# Patient Record
Sex: Female | Born: 1985 | Race: White | Hispanic: No | Marital: Married | Smoking: Former smoker
Health system: Southern US, Community
[De-identification: ages and names within clinical notes are randomized; demographics above are authoritative.]

## PROBLEM LIST (undated history)

## (undated) DIAGNOSIS — F1910 Other psychoactive substance abuse, uncomplicated: Secondary | ICD-10-CM

## (undated) DIAGNOSIS — N912 Amenorrhea, unspecified: Secondary | ICD-10-CM

## (undated) HISTORY — DX: Other psychoactive substance abuse, uncomplicated: F19.10

## (undated) HISTORY — DX: Amenorrhea, unspecified: N91.2

## (undated) HISTORY — PX: TONSILLECTOMY: SHX5217

---
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
## 2007-04-24 ENCOUNTER — Inpatient Hospital Stay: Payer: Self-pay

## 2008-04-03 ENCOUNTER — Emergency Department: Payer: Self-pay | Admitting: Emergency Medicine

## 2008-04-05 ENCOUNTER — Inpatient Hospital Stay: Payer: Self-pay | Admitting: Rheumatology

## 2011-01-29 ENCOUNTER — Emergency Department: Payer: Self-pay | Admitting: Emergency Medicine

## 2016-11-30 NOTE — L&D Delivery Note (Signed)
1345 Called in room to see patient, reports pelvic pressure and the urge to push. SVE: 10/100/+2, vertex. Effective maternal pushing efforts noted.   Spontaneous vaginal birth of liveborn female patient at 60. Infant immediately to maternal abdomen. Delayed cord clamping. Three (3) vessel cord. Cord blood collected. APGARS: 8, 9. Weight: 3880 g (8 pounds 9 ounces). Receiving nurse at bedside for birth.   Pitocin infusing. Spontaneous delivery of intact placenta at 1411. Second degree perineal laceration repaired with 3-0 vicryl rapide, hemostatic.  Left periurethral laceration, hemostatic not repaired. Adequate pain relief with epidural anesthesia. EBL: 300 ml. Vault check complete x 2. Counts correct x 2.   Mom to postpartum. Infant skin to skin. Initiate routine postpartum care and orders.   FOB present at bedside for birth.   Dr. Logan Bores notified of patient's desire for postpartum bilateral tubal ligation.    Gunnar Bulla, CNM

## 2017-01-04 ENCOUNTER — Ambulatory Visit (INDEPENDENT_AMBULATORY_CARE_PROVIDER_SITE_OTHER): Payer: Medicaid Other | Admitting: Certified Nurse Midwife

## 2017-01-04 VITALS — BP 115/66 | HR 84 | Ht 63.0 in | Wt 207.4 lb

## 2017-01-04 DIAGNOSIS — Z113 Encounter for screening for infections with a predominantly sexual mode of transmission: Secondary | ICD-10-CM

## 2017-01-04 DIAGNOSIS — Z3481 Encounter for supervision of other normal pregnancy, first trimester: Secondary | ICD-10-CM

## 2017-01-04 DIAGNOSIS — E669 Obesity, unspecified: Secondary | ICD-10-CM

## 2017-01-04 DIAGNOSIS — Z1389 Encounter for screening for other disorder: Secondary | ICD-10-CM

## 2017-01-04 NOTE — Progress Notes (Signed)
Tricia Gregory presents for NOB nurse interview visit. Pregnancy confirmation done at [facility] on 12/23/2016, UPT: positive. LMP: 10/29/2016 approx, pt unsure and had irregular menses. Ultrasound needed to determine dating and viability.   G-2.  P-1001 . Pregnancy education material explained and given. No cats in the home. NOB labs ordered. TSH/HbgA1c due to Increased BMI of 35.4.  HIV labs and Drug screen were explained optional and she did not decline. Drug screen ordered. PNV encouraged. Genetic screening options discussed, undecided and may wait to discuss with provider. Pt. To follow up with provider in 2 weeks for NOB physical, unless ultrasound determines otherwise.  All questions answered.

## 2017-01-04 NOTE — Patient Instructions (Signed)
Pregnancy and Zika Virus Disease Introduction Zika virus disease, or Zika, is an illness that can spread to people from mosquitoes that carry the virus. It may also spread from person to person through infected body fluids. Zika first occurred in [place], but recently it has spread to new areas. The virus occurs in tropical climates. The location of Zika continues to change. Most people who become infected with Zika virus do not develop serious illness. However, Zika may cause birth defects in an unborn baby whose mother is infected with the virus. It may also increase the risk of miscarriage. What are the symptoms of Zika virus disease? In many cases, people who have been infected with Zika virus do not develop any symptoms. If symptoms appear, they usually start about a week after the person is infected. Symptoms are usually mild. They may include:  Fever.  Rash.  Red eyes.  Joint pain. How does Zika virus disease spread? The main way that Zika virus spreads is through the bite of a certain type of mosquito. Unlike most types of mosquitos, which bite only at night, the type of mosquito that carries Zika virus bites both at night and during the day. Zika virus can also spread through sexual contact, through a blood transfusion, and from a mother to her baby before or during birth. Once you have had Zika virus disease, it is unlikely that you will get it again. Can I pass Zika to my baby during pregnancy? Yes, Zika can pass from a mother to her baby before or during birth. What problems can Zika cause for my baby? A woman who is infected with Zika virus while pregnant is at risk of having her baby born with a condition in which the brain or head is smaller than expected (microcephaly). Babies who have microcephaly can have developmental delays, seizures, hearing problems, and vision problems. Having Zika virus disease during pregnancy can also increase the risk of miscarriage. How can Zika  virus disease be prevented? There is no vaccine to prevent Zika. The best way to prevent the disease is to avoid infected mosquitoes and avoid exposure to body fluids that can spread the virus. Avoid any possible exposure to Zika by taking the following precautions. For women and their sex partners:  Avoid traveling to high-risk areas. The locations where Zika is being reported change often. To identify high-risk areas, check the CDC travel website: www.cdc.gov/zika/geo/index.html  If you or your sex partner must travel to a high-risk area, talk with a health care provider before and after traveling.  Take all precautions to avoid mosquito bites if you live in, or travel to, any of the high-risk areas. Insect repellents are safe to use during pregnancy.  Ask your health care provider when it is safe to have sexual contact. For women:  If you are pregnant or trying to become pregnant, avoid sexual contact with persons who may have been exposed to Zika virus, persons who have possible symptoms of Zika, or persons whose history you are unsure about. If you choose to have sexual contact with someone who may have been exposed to Zika virus, use condoms correctly during the entire duration of sexual activity, every time. Do not share sexual devices, as you may be exposed to body fluids.  Ask your health care provider about when it is safe to attempt pregnancy after a possible exposure to Zika virus. What steps should I take to avoid mosquito bites? Take these steps to avoid mosquito bites when you   are in a high-risk area:  Wear loose clothing that covers your arms and legs.  Limit your outdoor activities.  Do not open windows unless they have window screens.  Sleep under mosquito nets.  Use insect repellent. The best insect repellents have:  DEET, picaridin, oil of lemon eucalyptus (OLE), or [id number] in them.  Higher amounts of an active ingredient in them.  Remember that insect repellents  are safe to use during pregnancy.  Do not use OLE on children who are younger than 3 years of age. Do not use insect repellent on babies who are younger than 2 months of age.  Cover your child's stroller with mosquito netting. Make sure the netting fits snugly and that any loose netting does not cover your child's mouth or nose. Do not use a blanket as a mosquito-protection cover.  Do not apply insect repellent underneath clothing.  If you are using sunscreen, apply the sunscreen before applying the insect repellent.  Treat clothing with permethrin. Do not apply permethrin directly to your skin. Follow label directions for safe use.  Get rid of standing water, where mosquitoes may reproduce. Standing water is often found in items such as buckets, bowls, animal food dishes, and flowerpots. When you return from traveling to any high-risk area, continue taking actions to protect yourself against mosquito bites for 3 weeks, even if you show no signs of illness. This will prevent spreading Zika virus to uninfected mosquitoes. What should I know about the sexual transmission of Zika? People can spread Zika to their sexual partners during vaginal, anal, or oral sex, or by sharing sexual devices. Many people with Zika do not develop symptoms, so a person could spread the disease without knowing that they are infected. The greatest risk is to women who are pregnant or who may become pregnant. Zika virus can live longer in semen than it can live in blood. Couples can prevent sexual transmission of the virus by:  Using condoms correctly during the entire duration of sexual activity, every time. This includes vaginal, anal, and oral sex.  Not sharing sexual devices. Sharing increases your risk of being exposed to body fluid from another person.  Avoiding all sexual activity until your health care provider says it is safe. Should I be tested for Zika virus? A sample of your blood can be tested for Zika  virus. A pregnant woman should be tested if she may have been exposed to the virus or if she has symptoms of Zika. She may also have additional tests done during her pregnancy, such ultrasound testing. Talk with your health care provider about which tests are recommended. This information is not intended to replace advice given to you by your health care provider. Make sure you discuss any questions you have with your health care provider. Document Released: 08/07/2015 Document Revised: 04/23/2016 Document Reviewed: 07/31/2015  2017 Elsevier Minor Illnesses and Medications in Pregnancy  Cold/Flu:  Sudafed for congestion- Robitussin (plain) for cough- Tylenol for discomfort.  Please follow the directions on the label.  Try not to take any more than needed.  OTC Saline nasal spray and air humidifier or cool-mist  Vaporizer to sooth nasal irritation and to loosen congestion.  It is also important to increase intake of non carbonated fluids, especially if you have a fever.  Constipation:  Colace-2 capsules at bedtime; Metamucil- follow directions on label; Senokot- 1 tablet at bedtime.  Any one of these medications can be used.  It is also very important to increase   fluids and fruits along with regular exercise.  If problem persists please call the office.  Diarrhea:  Kaopectate as directed on the label.  Eat a bland diet and increase fluids.  Avoid highly seasoned foods.  Headache:  Tylenol 1 or 2 tablets every 3-4 hours as needed  Indigestion:  Maalox, Mylanta, Tums or Rolaids- as directed on label.  Also try to eat small meals and avoid fatty, greasy or spicy foods.  Nausea with or without Vomiting:  Nausea in pregnancy is caused by increased levels of hormones in the body which influence the digestive system and cause irritation when stomach acids accumulate.  Symptoms usually subside after 1st trimester of pregnancy.  Try the following: 1. Keep saltines, graham crackers or dry toast by your bed to  eat upon awakening. 2. Don't let your stomach get empty.  Try to eat 5-6 small meals per day instead of 3 large ones. 3. Avoid greasy fatty or highly seasoned foods.  4. Take OTC Unisom 1 tablet at bed time along with OTC Vitamin B6 25-50 mg 3 times per day.    If nausea continues with vomiting and you are unable to keep down food and fluids you may need a prescription medication.  Please notify your provider.   Sore throat:  Chloraseptic spray, throat lozenges and or plain Tylenol.  Vaginal Yeast Infection:  OTC Monistat for 7 days as directed on label.  If symptoms do not resolve within a week notify provider.  If any of the above problems do not subside with recommended treatment please call the office for further assistance.   Do not take Aspirin, Advil, Motrin or Ibuprofen.  * * OTC= Over the counter Hyperemesis Gravidarum Hyperemesis gravidarum is a severe form of nausea and vomiting that happens during pregnancy. Hyperemesis is worse than morning sickness. It may cause you to have nausea or vomiting all day for many days. It may keep you from eating and drinking enough food and liquids. Hyperemesis usually occurs during the first half (the first 20 weeks) of pregnancy. It often goes away once a woman is in her second half of pregnancy. However, sometimes hyperemesis continues through an entire pregnancy. What are the causes? The cause of this condition is not known. It may be related to changes in chemicals (hormones) in the body during pregnancy, such as the high level of pregnancy hormone (human chorionic gonadotropin) or the increase in the female sex hormone (estrogen). What are the signs or symptoms? Symptoms of this condition include:  Severe nausea and vomiting.  Nausea that does not go away.  Vomiting that does not allow you to keep any food down.  Weight loss.  Body fluid loss (dehydration).  Having no desire to eat, or not liking food that you have previously  enjoyed. How is this diagnosed? This condition may be diagnosed based on:  A physical exam.  Your medical history.  Your symptoms.  Blood tests.  Urine tests. How is this treated? This condition may be managed with medicine. If medicines to do not help relieve nausea and vomiting, you may need to receive fluids through an IV tube at the hospital. Follow these instructions at home:  Take over-the-counter and prescription medicines only as told by your health care provider.  Avoid iron pills and multivitamins that contain iron for the first 3-4 months of pregnancy. If you take prescription iron pills, do not stop taking them unless your health care provider approves.  Take the following actions to help   prevent nausea and vomiting:  In the morning, before getting out of bed, try eating a couple of dry crackers or a piece of toast.  Avoid foods and smells that upset your stomach. Fatty and spicy foods may make nausea worse.  Eat 5-6 small meals a day.  Do not drink fluids while eating meals. Drink between meals.  Eat or suck on things that have ginger in them. Ginger can help relieve nausea.  Avoid food preparation. The smell of food can spoil your appetite or trigger nausea.  Follow instructions from your health care provider about eating or drinking restrictions.  For snacks, eat high-protein foods, such as cheese.  Keep all follow-up and pre-birth (prenatal) visits as told by your health care provider. This is important. Contact a health care provider if:  You have pain in your abdomen.  You have a severe headache.  You have vision problems.  You are losing weight. Get help right away if:  You cannot drink fluids without vomiting.  You vomit blood.  You have constant nausea and vomiting.  You are very weak.  You are very thirsty.  You feel dizzy.  You faint.  You have a fever or other symptoms that last for more than 2-3 days.  You have a fever and  your symptoms suddenly get worse. Summary  Hyperemesis gravidarum is a severe form of nausea and vomiting that happens during pregnancy.  Making some changes to your eating habits may help relieve nausea and vomiting.  This condition may be managed with medicine.  If medicines to do not help relieve nausea and vomiting, you may need to receive fluids through an IV tube at the hospital. This information is not intended to replace advice given to you by your health care provider. Make sure you discuss any questions you have with your health care provider. Document Released: 11/16/2005 Document Revised: 07/15/2016 Document Reviewed: 07/15/2016 Elsevier Interactive Patient Education  2017 [employer] First Trimester of Pregnancy The first trimester of pregnancy is from week 1 until the end of week 12 (months 1 through 3). During this time, your baby will begin to develop inside you. At 6-8 weeks, the eyes and face are formed, and the heartbeat can be seen on ultrasound. At the end of 12 weeks, all the baby's organs are formed. Prenatal care is all the medical care you receive before the birth of your baby. Make sure you get good prenatal care and follow all of your doctor's instructions. Follow these instructions at home: Medicines  Take medicine only as told by your doctor. Some medicines are safe and some are not during pregnancy.  Take your prenatal vitamins as told by your doctor.  Take medicine that helps you poop (stool softener) as needed if your doctor says it is okay. Diet  Eat regular, healthy meals.  Your doctor will tell you the amount of weight gain that is right for you.  Avoid raw meat and uncooked cheese.  If you feel sick to your stomach (nauseous) or throw up (vomit):  Eat 4 or 5 small meals a day instead of 3 large meals.  Try eating a few soda crackers.  Drink liquids between meals instead of during meals.  If you have a hard time pooping  (constipation):  Eat high-fiber foods like fresh vegetables, fruit, and whole grains.  Drink enough fluids to keep your pee (urine) clear or pale yellow. Activity and Exercise  Exercise only as told by your doctor. Stop exercising if   you have cramps or pain in your lower belly (abdomen) or low back.  Try to avoid standing for long periods of time. Move your legs often if you must stand in one place for a long time.  Avoid heavy lifting.  Wear low-heeled shoes. Sit and stand up straight.  You can have sex unless your doctor tells you not to. Relief of Pain or Discomfort  Wear a good support bra if your breasts are sore.  Take warm water baths (sitz baths) to soothe pain or discomfort caused by hemorrhoids. Use hemorrhoid cream if your doctor says it is okay.  Rest with your legs raised if you have leg cramps or low back pain.  Wear support hose if you have puffy, bulging veins (varicose veins) in your legs. Raise (elevate) your feet for 15 minutes, 3-4 times a day. Limit salt in your diet. Prenatal Care  Schedule your prenatal visits by the twelfth week of pregnancy.  Write down your questions. Take them to your prenatal visits.  Keep all your prenatal visits as told by your doctor. Safety  Wear your seat belt at all times when driving.  Make a list of emergency phone numbers. The list should include numbers for family, friends, the hospital, and police and fire departments. General Tips  Ask your doctor for a referral to a local prenatal class. Begin classes no later than at the start of month 6 of your pregnancy.  Ask for help if you need counseling or help with nutrition. Your doctor can give you advice or tell you where to go for help.  Do not use hot tubs, steam rooms, or saunas.  Do not douche or use tampons or scented sanitary pads.  Do not cross your legs for long periods of time.  Avoid litter boxes and soil used by cats.  Avoid all smoking, herbs, and  alcohol. Avoid drugs not approved by your doctor.  Do not use any tobacco products, including cigarettes, chewing tobacco, and electronic cigarettes. If you need help quitting, ask your doctor. You may get counseling or other support to help you quit.  Visit your dentist. At home, brush your teeth with a soft toothbrush. Be gentle when you floss. Get help if:  You are dizzy.  You have mild cramps or pressure in your lower belly.  You have a nagging pain in your belly area.  You continue to feel sick to your stomach, throw up, or have watery poop (diarrhea).  You have a bad smelling fluid coming from your vagina.  You have pain with peeing (urination).  You have increased puffiness (swelling) in your face, hands, legs, or ankles. Get help right away if:  You have a fever.  You are leaking fluid from your vagina.  You have spotting or bleeding from your vagina.  You have very bad belly cramping or pain.  You gain or lose weight rapidly.  You throw up blood. It may look like coffee grounds.  You are around people who have German measles, fifth disease, or chickenpox.  You have a very bad headache.  You have shortness of breath.  You have any kind of trauma, such as from a fall or a car accident. This information is not intended to replace advice given to you by your health care provider. Make sure you discuss any questions you have with your health care provider. Document Released: 05/04/2008 Document Revised: 04/23/2016 Document Reviewed: 09/26/2013 Else[employer] Interactive Patient Education  2017 [employer] Commonly Asked Questions   During Pregnancy  Cats: A parasite can be excreted in cat feces.  To avoid exposure you need to have another person empty the little box.  If you must empty the litter box you will need to wear gloves.  Wash your hands after handling your cat.  This parasite can also be found in raw or undercooked meat so this should also be avoided.  Colds,  Sore Throats, Flu: Please check your medication sheet to see what you can take for symptoms.  If your symptoms are unrelieved by these medications please call the office.  Dental Work: Most any dental work your dentist recommends is permitted.  X-rays should only be taken during the first trimester if absolutely necessary.  Your abdomen should be shielded with a lead apron during all x-rays.  Please notify your provider prior to receiving any x-rays.  Novocaine is fine; gas is not recommended.  If your dentist requires a note from us prior to dental work please call the office and we will provide one for you.  Exercise: Exercise is an important part of staying healthy during your pregnancy.  You may continue most exercises you were accustomed to prior to pregnancy.  Later in your pregnancy you will most likely notice you have difficulty with activities requiring balance like riding a bicycle.  It is important that you listen to your body and avoid activities that put you at a higher risk of falling.  Adequate rest and staying well hydrated are a must!  If you have questions about the safety of specific activities ask your provider.    Exposure to Children with illness: Try to avoid obvious exposure; report any symptoms to us when noted,  If you have chicken pos, red measles or mumps, you should be immune to these diseases.   Please do not take any vaccines while pregnant unless you have checked with your OB provider.  Fetal Movement: After 28 weeks we recommend you do "kick counts" twice daily.  Lie or sit down in a calm quiet environment and count your baby movements "kicks".  You should feel your baby at least 10 times per hour.  If you have not felt 10 kicks within the first hour get up, walk around and have something sweet to eat or drink then repeat for an additional hour.  If count remains less than 10 per hour notify your provider.  Fumigating: Follow your pest control agent's advice as to how long  to stay out of your home.  Ventilate the area well before re-entering.  Hemorrhoids:   Most over-the-counter preparations can be used during pregnancy.  Check your medication to see what is safe to use.  It is important to use a stool softener or fiber in your diet and to drink lots of liquids.  If hemorrhoids seem to be getting worse please call the office.   Hot Tubs:  Hot tubs Jacuzzis and saunas are not recommended while pregnant.  These increase your internal body temperature and should be avoided.  Intercourse:  Sexual intercourse is safe during pregnancy as long as you are comfortable, unless otherwise advised by your provider.  Spotting may occur after intercourse; report any bright red bleeding that is heavier than spotting.  Labor:  If you know that you are in labor, please go to the hospital.  If you are unsure, please call the office and let us help you decide what to do.  Lifting, straining, etc:  If your job requires heavy lifting or   straining please check with your provider for any limitations.  Generally, you should not lift items heavier than that you can lift simply with your hands and arms (no back muscles)  Painting:  Paint fumes do not harm your pregnancy, but may make you ill and should be avoided if possible.  Latex or water based paints have less odor than oils.  Use adequate ventilation while painting.  Permanents & Hair Color:  Chemicals in hair dyes are not recommended as they cause increase hair dryness which can increase hair loss during pregnancy.  " Highlighting" and permanents are allowed.  Dye may be absorbed differently and permanents may not hold as well during pregnancy.  Sunbathing:  Use a sunscreen, as skin burns easily during pregnancy.  Drink plenty of fluids; avoid over heating.  Tanning Beds:  Because their possible side effects are still unknown, tanning beds are not recommended.  Ultrasound Scans:  Routine ultrasounds are performed at approximately 20  weeks.  You will be able to see your baby's general anatomy an if you would like to know the gender this can usually be determined as well.  If it is questionable when you conceived you may also receive an ultrasound early in your pregnancy for dating purposes.  Otherwise ultrasound exams are not routinely performed unless there is a medical necessity.  Although you can request a scan we ask that you pay for it when conducted because insurance does not cover " patient request" scans.  Work: If your pregnancy proceeds without complications you may work until your due date, unless your physician or employer advises otherwise.  Round Ligament Pain/Pelvic Discomfort:  Sharp, shooting pains not associated with bleeding are fairly common, usually occurring in the second trimester of pregnancy.  They tend to be worse when standing up or when you remain standing for long periods of time.  These are the result of pressure of certain pelvic ligaments called "round ligaments".  Rest, Tylenol and heat seem to be the most effective relief.  As the womb and fetus grow, they rise out of the pelvis and the discomfort improves.  Please notify the office if your pain seems different than that described.  It may represent a more serious condition.   

## 2017-01-05 ENCOUNTER — Other Ambulatory Visit: Payer: Self-pay | Admitting: Obstetrics and Gynecology

## 2017-01-05 ENCOUNTER — Ambulatory Visit (INDEPENDENT_AMBULATORY_CARE_PROVIDER_SITE_OTHER): Payer: Medicaid Other

## 2017-01-05 DIAGNOSIS — Z1389 Encounter for screening for other disorder: Secondary | ICD-10-CM | POA: Diagnosis not present

## 2017-01-05 DIAGNOSIS — Z113 Encounter for screening for infections with a predominantly sexual mode of transmission: Secondary | ICD-10-CM | POA: Diagnosis not present

## 2017-01-05 DIAGNOSIS — O09899 Supervision of other high risk pregnancies, unspecified trimester: Secondary | ICD-10-CM | POA: Insufficient documentation

## 2017-01-05 DIAGNOSIS — Z3481 Encounter for supervision of other normal pregnancy, first trimester: Secondary | ICD-10-CM

## 2017-01-05 DIAGNOSIS — Z283 Underimmunization status: Secondary | ICD-10-CM | POA: Insufficient documentation

## 2017-01-05 DIAGNOSIS — O9989 Other specified diseases and conditions complicating pregnancy, childbirth and the puerperium: Principal | ICD-10-CM

## 2017-01-05 LAB — HEMOGLOBIN A1C
Est. average glucose Bld gHb Est-mCnc: 105 mg/dL
Hgb A1c MFr Bld: 5.3 % (ref 4.8–5.6)

## 2017-01-05 LAB — CBC WITH DIFFERENTIAL/PLATELET
Basophils Absolute: 0 10*3/uL (ref 0.0–0.2)
Basos: 0 %
EOS (ABSOLUTE): 0 10*3/uL (ref 0.0–0.4)
Eos: 0 %
Hematocrit: 38.6 % (ref 34.0–46.6)
Hemoglobin: 12.7 g/dL (ref 11.1–15.9)
IMMATURE GRANULOCYTES: 0 %
Immature Grans (Abs): 0 10*3/uL (ref 0.0–0.1)
LYMPHS ABS: 1.6 10*3/uL (ref 0.7–3.1)
Lymphs: 17 %
MCH: 29.1 pg (ref 26.6–33.0)
MCHC: 32.9 g/dL (ref 31.5–35.7)
MCV: 88 fL (ref 79–97)
MONOS ABS: 0.4 10*3/uL (ref 0.1–0.9)
Monocytes: 4 %
NEUTROS PCT: 79 %
Neutrophils Absolute: 7.7 10*3/uL — ABNORMAL HIGH (ref 1.4–7.0)
PLATELETS: 262 10*3/uL (ref 150–379)
RBC: 4.37 x10E6/uL (ref 3.77–5.28)
RDW: 14 % (ref 12.3–15.4)
WBC: 9.9 10*3/uL (ref 3.4–10.8)

## 2017-01-05 LAB — HEPATITIS B SURFACE ANTIGEN: Hepatitis B Surface Ag: NEGATIVE

## 2017-01-05 LAB — ANTIBODY SCREEN: Antibody Screen: NEGATIVE

## 2017-01-05 LAB — RUBELLA SCREEN

## 2017-01-05 LAB — HIV ANTIBODY (ROUTINE TESTING W REFLEX): HIV SCREEN 4TH GENERATION: NONREACTIVE

## 2017-01-05 LAB — TSH: TSH: 0.538 u[IU]/mL (ref 0.450–4.500)

## 2017-01-05 LAB — RH TYPE: RH TYPE: POSITIVE

## 2017-01-05 LAB — VARICELLA ZOSTER ANTIBODY, IGG: Varicella zoster IgG: 1237 index (ref >165)

## 2017-01-05 LAB — RPR: RPR Ser Ql: NONREACTIVE

## 2017-01-05 LAB — ABO

## 2017-01-06 LAB — URINE CULTURE, OB REFLEX

## 2017-01-06 LAB — GC/CHLAMYDIA PROBE AMP
CHLAMYDIA, DNA PROBE: NEGATIVE
NEISSERIA GONORRHOEAE BY PCR: NEGATIVE

## 2017-01-06 LAB — CULTURE, OB URINE

## 2017-01-10 LAB — MONITOR DRUG PROFILE 14(MW)
AMPHETAMINE SCREEN URINE: NEGATIVE ng/mL
BARBITURATE SCREEN URINE: NEGATIVE ng/mL
BENZODIAZEPINE SCREEN, URINE: NEGATIVE ng/mL
Buprenorphine, Urine: NEGATIVE ng/mL
CANNABINOIDS UR QL SCN: NEGATIVE ng/mL
COCAINE(METAB.)SCREEN, URINE: NEGATIVE ng/mL
CREATININE(CRT), U: 296.3 mg/dL (ref 20.0–300.0)
FENTANYL, URINE: NEGATIVE pg/mL
Meperidine Screen, Urine: NEGATIVE ng/mL
OXYCODONE+OXYMORPHONE UR QL SCN: NEGATIVE ng/mL
Opiate Scrn, Ur: NEGATIVE ng/mL
PH UR, DRUG SCRN: 7.2 (ref 4.5–8.9)
PHENCYCLIDINE QUANTITATIVE URINE: NEGATIVE ng/mL
Propoxyphene Scrn, Ur: NEGATIVE ng/mL
SPECIFIC GRAVITY: 1.027
Tramadol Screen, Urine: NEGATIVE ng/mL

## 2017-01-10 LAB — URINALYSIS, ROUTINE W REFLEX MICROSCOPIC
BILIRUBIN UA: NEGATIVE
GLUCOSE, UA: NEGATIVE
KETONES UA: NEGATIVE
Nitrite, UA: NEGATIVE
Urobilinogen, Ur: 0.2 mg/dL (ref 0.2–1.0)
pH, UA: 7 (ref 5.0–7.5)

## 2017-01-10 LAB — NICOTINE SCREEN, URINE: Cotinine Ql Scrn, Ur: POSITIVE ng/mL

## 2017-01-10 LAB — MICROSCOPIC EXAMINATION
CASTS: NONE SEEN /LPF
Epithelial Cells (non renal): >10 /hpf — AB (ref 0–10)

## 2017-01-10 LAB — METHADONE (GC/LC/MS), URINE
METHADONE GC/MS CONF: 1950 ng/mL
METHADONE: POSITIVE — AB

## 2017-01-11 ENCOUNTER — Encounter: Payer: Self-pay | Admitting: Obstetrics and Gynecology

## 2017-01-12 ENCOUNTER — Other Ambulatory Visit: Payer: Self-pay | Admitting: Obstetrics and Gynecology

## 2017-01-19 ENCOUNTER — Other Ambulatory Visit: Payer: Self-pay | Admitting: Obstetrics and Gynecology

## 2017-01-19 ENCOUNTER — Ambulatory Visit (INDEPENDENT_AMBULATORY_CARE_PROVIDER_SITE_OTHER): Payer: Medicaid Other | Admitting: Obstetrics and Gynecology

## 2017-01-19 ENCOUNTER — Encounter: Payer: Self-pay | Admitting: Obstetrics and Gynecology

## 2017-01-19 VITALS — BP 127/62 | HR 86 | Wt 207.5 lb

## 2017-01-19 DIAGNOSIS — E669 Obesity, unspecified: Secondary | ICD-10-CM | POA: Insufficient documentation

## 2017-01-19 DIAGNOSIS — Z3401 Encounter for supervision of normal first pregnancy, first trimester: Secondary | ICD-10-CM

## 2017-01-19 DIAGNOSIS — Z283 Underimmunization status: Secondary | ICD-10-CM

## 2017-01-19 DIAGNOSIS — O9989 Other specified diseases and conditions complicating pregnancy, childbirth and the puerperium: Secondary | ICD-10-CM

## 2017-01-19 DIAGNOSIS — Z2839 Other underimmunization status: Secondary | ICD-10-CM

## 2017-01-19 DIAGNOSIS — F1120 Opioid dependence, uncomplicated: Secondary | ICD-10-CM

## 2017-01-19 DIAGNOSIS — F17200 Nicotine dependence, unspecified, uncomplicated: Secondary | ICD-10-CM | POA: Insufficient documentation

## 2017-01-19 LAB — POCT URINALYSIS DIPSTICK
Bilirubin, UA: NEGATIVE
Glucose, UA: NEGATIVE
KETONES UA: NEGATIVE
Nitrite, UA: NEGATIVE
PH UA: 6
PROTEIN UA: NEGATIVE
SPEC GRAV UA: 1.01
UROBILINOGEN UA: 0.2

## 2017-01-19 NOTE — Patient Instructions (Signed)
Smoking During Pregnancy Smoking during pregnancy is unhealthy for you and your baby. Smoke from cigarettes, pipes, and cigars contains many chemicals that can cause cancer (carcinogens). Cigarettes also contain a stimulant drug (nicotine). When you smoke, harmful substances that you breathe in enter your bloodstream and can be passed on to your baby. This can affect your baby's development. If you are planning to become pregnant or have recently become pregnant, talk with your health care provider about quitting smoking. How does smoking affect me? Smoking increases your risk for many long-term (chronic) diseases. These diseases include cancer, lung diseases, and heart disease. Smoking during pregnancy increases your risk of:  Losing the pregnancy (miscarriage or stillbirth).  Giving birth too early (premature birth).  Pregnancy outside of the uterus (tubal pregnancy).  Having problems with the organ that provides the baby nourishment and oxygen (placenta), including:  Attachment of the placenta over the opening of the uterus (placenta previa).  Detachment of the placenta before the baby's birth (placental abruption).  Having your water break before labor begins (premature rupture of membranes). How does smoking affect my baby? Before Birth  Smoking during pregnancy:  Decreases blood flow and oxygen to your baby.  Increases your baby's risk of birth defects, such as heart defects.  Increases your baby's heart rate.  Slows your baby's growth in the uterus (intrauterine growth retardation). After Birth  Babies born to women who smoked during pregnancy may:  Have symptoms of nicotine withdrawal.  Need to stay in the hospital for special care.  May be too small at birth.  Have a high risk of:  Serious health problems or lifelong disabilities.  Sudden infant death syndrome (SIDS).  Becoming obese.  Developing behavior or learning problems. What can happen if changes are  not made? When babies are born with a birth defect or illness, they often need to stay in the hospital longer before going home. Hospital stays may also be longer if you had any complications during labor or delivery. Longer hospital stays and more treatments result in higher costs for health care. Many health issues among babies born to mothers who smoke can have a lifelong impact. This may include the long-term need for certain medicines, therapies, or other treatments. What are the benefits of not smoking during pregnancy? You have a much better chance of having a healthy pregnancy and a healthy baby if you do not smoke while you are pregnant. Not smoking also means that you will have a better chance of living a long and healthy life, and your baby will have a better chance of growing into a healthy child and adult. What actions can be taken? Quitting smoking can be difficult. Ask your health care provider for help to stop smoking. You may also consider:  Counseling to help you quit smoking (smoking cessation counseling).  Psychotherapy.  Acupuncture.  Hypnosis.  Telephone QUIT hotlines. If these methods do not help you, talk with your health care provider about other options. Do not take smoking cessation medicines or nicotine supplements unless your health care provider tells you to. Where to find more information: Learn more about smoking during pregnancy and quitting smoking from:  March of Dimes: www.marchofdimes.org/pregnancy/smoking-during-pregnancy.aspx  U.S. Department of Health and Human Services: women.smokefree.gov  American Cancer Society: www.cancer.org  American Heart Association: www.heart.org  National Cancer Institute: www.cancer.gov For help to quit smoking:  National smoking cessation telephone hotline: 1-800-QUIT NOW (784-8669) Contact a health care provider if:  You are struggling to quit smoking.    You are a smoker and you become pregnant or plan to  become pregnant.  You start smoking again after giving birth. Summary  Tobacco smoke contains harmful substances that can affect a baby's health and development.  Smoking increases the risk for serious problems, such as miscarriage, birth defects, or premature birth.  If you need help to quit smoking, ask your health care provider. This information is not intended to replace advice given to you by your health care provider. Make sure you discuss any questions you have with your health care provider. Document Released: 03/30/2005 Document Revised: 09/04/2016 Document Reviewed: 09/04/2016 Elsevier Interactive Patient Education  2017 [employer] Second Trimester of Pregnancy The second trimester is from week 13 through week 28 (months 4 through 6). The second trimester is often a time when you feel your best. Your body has also adjusted to being pregnant, and you begin to feel better physically. Usually, morning sickness has lessened or quit completely, you may have more energy, and you may have an increase in appetite. The second trimester is also a time when the fetus is growing rapidly. At the end of the sixth month, the fetus is about 9 inches long and weighs about 1 pounds. You will likely begin to feel the baby move (quickening) between 18 and 20 weeks of the pregnancy. Body changes during your second trimester Your body continues to go through many changes during your second trimester. The changes vary from woman to woman.  Your weight will continue to increase. You will notice your lower abdomen bulging out.  You may begin to get stretch marks on your hips, abdomen, and breasts.  You may develop headaches that can be relieved by medicines. The medicines should be approved by your health care provider.  You may urinate more often because the fetus is pressing on your bladder.  You may develop or continue to have heartburn as a result of your pregnancy.  You may develop  constipation because certain hormones are causing the muscles that push waste through your intestines to slow down.  You may develop hemorrhoids or swollen, bulging veins (varicose veins).  You may have back pain. This is caused by:  Weight gain.  Pregnancy hormones that are relaxing the joints in your pelvis.  A shift in weight and the muscles that support your balance.  Your breasts will continue to grow and they will continue to become tender.  Your gums may bleed and may be sensitive to brushing and flossing.  Dark spots or blotches (chloasma, mask of pregnancy) may develop on your face. This will likely fade after the baby is born.  A dark line from your belly button to the pubic area (linea nigra) may appear. This will likely fade after the baby is born.  You may have changes in your hair. These can include thickening of your hair, rapid growth, and changes in texture. Some women also have hair loss during or after pregnancy, or hair that feels dry or thin. Your hair will most likely return to normal after your baby is born. What to expect at prenatal visits During a routine prenatal visit:  You will be weighed to make sure you and the fetus are growing normally.  Your blood pressure will be taken.  Your abdomen will be measured to track your baby's growth.  The fetal heartbeat will be listened to.  Any test results from the previous visit will be discussed. Your health care provider may ask you:  How you  are feeling.  If you are feeling the baby move.  If you have had any abnormal symptoms, such as leaking fluid, bleeding, severe headaches, or abdominal cramping.  If you are using any tobacco products, including cigarettes, chewing tobacco, and electronic cigarettes.  If you have any questions. Other tests that may be performed during your second trimester include:  Blood tests that check for:  Low iron levels (anemia).  Gestational diabetes (between 24 and 28  weeks).  Rh antibodies. This is to check for a protein on red blood cells (Rh factor).  Urine tests to check for infections, diabetes, or protein in the urine.  An ultrasound to confirm the proper growth and development of the baby.  An amniocentesis to check for possible genetic problems.  Fetal screens for spina bifida and Down syndrome.  HIV (human immunodeficiency virus) testing. Routine prenatal testing includes screening for HIV, unless you choose not to have this test. Follow these instructions at home: Eating and drinking  Continue to eat regular, healthy meals.  Avoid raw meat, uncooked cheese, cat litter boxes, and soil used by cats. These carry germs that can cause birth defects in the baby.  Take your prenatal vitamins.  Take 1500-2000 mg of calcium daily starting at the 20th week of pregnancy until you deliver your baby.  If you develop constipation:  Take over-the-counter or prescription medicines.  Drink enough fluid to keep your urine clear or pale yellow.  Eat foods that are high in fiber, such as fresh fruits and vegetables, whole grains, and beans.  Limit foods that are high in fat and processed sugars, such as fried and sweet foods. Activity  Exercise only as directed by your health care provider. Experiencing uterine cramps is a good sign to stop exercising.  Avoid heavy lifting, wear low heel shoes, and practice good posture.  Wear your seat belt at all times when driving.  Rest with your legs elevated if you have leg cramps or low back pain.  Wear a good support bra for breast tenderness.  Do not use hot tubs, steam rooms, or saunas. Lifestyle  Avoid all smoking, herbs, alcohol, and unprescribed drugs. These chemicals affect the formation and growth of the baby.  Do not use any products that contain nicotine or tobacco, such as cigarettes and e-cigarettes. If you need help quitting, ask your health care provider.  A sexual relationship may be  continued unless your health care provider directs you otherwise. General instructions  Follow your health care provider's instructions regarding medicine use. There are medicines that are either safe or unsafe to take during pregnancy.  Take warm sitz baths to soothe any pain or discomfort caused by hemorrhoids. Use hemorrhoid cream if your health care provider approves.  If you develop varicose veins, wear support hose. Elevate your feet for 15 minutes, 3-4 times a day. Limit salt in your diet.  Visit your dentist if you have not gone yet during your pregnancy. Use a soft toothbrush to brush your teeth and be gentle when you floss.  Keep all follow-up prenatal visits as told by your health care provider. This is important. Contact a health care provider if:  You have dizziness.  You have mild pelvic cramps, pelvic pressure, or nagging pain in the abdominal area.  You have persistent nausea, vomiting, or diarrhea.  You have a bad smelling vaginal discharge.  You have pain with urination. Get help right away if:  You have a fever.  You are leaking fluid from  your vagina.  You have spotting or bleeding from your vagina.  You have severe abdominal cramping or pain.  You have rapid weight gain or weight loss.  You have shortness of breath with chest pain.  You notice sudden or extreme swelling of your face, hands, ankles, feet, or legs.  You have not felt your baby move in over an hour.  You have severe headaches that do not go away with medicine.  You have vision changes. Summary  The second trimester is from week 13 through week 28 (months 4 through 6). It is also a time when the fetus is growing rapidly.  Your body goes through many changes during pregnancy. The changes vary from woman to woman.  Avoid all smoking, herbs, alcohol, and unprescribed drugs. These chemicals affect the formation and growth your baby.  Do not use any tobacco products, such as cigarettes,  chewing tobacco, and e-cigarettes. If you need help quitting, ask your health care provider.  Contact your health care provider if you have any questions. Keep all prenatal visits as told by your health care provider. This is important. This information is not intended to replace advice given to you by your health care provider. Make sure you discuss any questions you have with your health care provider. Document Released: 11/10/2001 Document Revised: 04/23/2016 Document Reviewed: 01/17/2013 Elsevier Interactive Patient Education  2017 [employer].

## 2017-01-19 NOTE — Progress Notes (Signed)
NOB physical- pt is doing well, denies any complaints 

## 2017-01-19 NOTE — Progress Notes (Signed)
NEW OB HISTORY AND PHYSICAL  SUBJECTIVE:       Tricia MooresChristine A Gregory is a 31 y.o. [id number] female, Patient's last menstrual period was 10/29/2016 (approximate)., Estimated Date of Delivery: 08/05/17, 614w5d, presents today for establishment of Prenatal Care. She has no unusual complaints.      Gynecologic History Patient's last menstrual period was 10/29/2016 (approximate). Normal Contraception: none Last Pap: 2009. Results were: normal  Obstetric History OB History  Gravida Para Term Preterm AB Living  2 1 1     1   SAB TAB Ectopic Multiple Live Births          1    # Outcome Date GA Lbr Len/2nd Weight Sex Delivery Anes PTL Lv  2 Current           1 Term 04/25/07 [id number]  8 lb 11.2 oz (3.946 kg) F Vag-Spont  N LIV      Past Medical History:  Diagnosis Date  . Amenorrhea   . Substance abuse    started Methadone in 2015    Past Surgical History:  Procedure Laterality Date  . TONSILLECTOMY      Current Outpatient Prescriptions on File Prior to Visit  Medication Sig Dispense Refill  . methadone (DOLOPHINE) 10 MG/5ML solution Take 35 mg by mouth daily.    . Prenatal Vit-Fe Fumarate-FA (MULTIVITAMIN-PRENATAL) 27-0.8 MG TABS tablet Take 1 tablet by mouth daily at 12 noon.     No current facility-administered medications on file prior to visit.     No Known Allergies  Social History   Social History  . Marital status: Married    Spouse name: N/A  . Number of children: N/A  . Years of education: N/A   Occupational History  . Not on file.   Social History Main Topics  . Smoking status: Light Tobacco Smoker    Types: Cigarettes  . Smokeless tobacco: Never Used     Comment: 1-2 cigarettes  . Alcohol use No  . Drug use: Yes  . Sexual activity: Yes    Partners: Male    Birth control/ protection: None   Other Topics Concern  . Not on file   Social History Narrative  . No narrative on file    Family History  Problem Relation Age of Onset  . Cancer Maternal  Grandmother 50    breast  . Diabetes Maternal Grandfather     The following portions of the patient's history were reviewed and updated as appropriate: allergies, current medications, past OB history, past medical history, past surgical history, past family history, past social history, and problem list.    OBJECTIVE: Initial Physical Exam (New OB)  GENERAL APPEARANCE: alert, well appearing, in no apparent distress, oriented to person, place and time, overweight HEAD: normocephalic, atraumatic MOUTH: mucous membranes moist, pharynx normal without lesions and dental hygiene good THYROID: no thyromegaly or masses present BREASTS: not examined LUNGS: clear to auscultation, no wheezes, rales or rhonchi, symmetric air entry HEART: regular rate and rhythm, no murmurs ABDOMEN: soft, nontender, nondistended, no abnormal masses, no epigastric pain, obese, fundus not palpable and FHT present EXTREMITIES: no redness or tenderness in the calves or thighs SKIN: normal coloration and turgor, no rashes LYMPH NODES: no adenopathy palpable NEUROLOGIC: alert, oriented, normal speech, no focal findings or movement disorder noted  PELVIC EXAM EXTERNAL GENITALIA: normal appearing vulva with no masses, tenderness or lesions VAGINA: discharge thin and slight green in color CERVIX: no lesions or cervical motion tenderness UTERUS: gravid and consistent with  12 weeks ADNEXA: no masses palpable and nontender  ASSESSMENT: Normal pregnancy BMI 35 Methadone use Smoker- trying to quit  PLAN: Prenatal care Genetic screening obtained today Counseled on tobacco and methadone use in pregnancy and NAS Early glucola at 16-20 weeks See orders

## 2017-01-20 LAB — CYTOLOGY - PAP

## 2017-01-26 ENCOUNTER — Other Ambulatory Visit: Payer: Self-pay | Admitting: Obstetrics and Gynecology

## 2017-01-26 LAB — MATERNIT 21 PLUS CORE, BLOOD
CHROMOSOME 13: NEGATIVE
CHROMOSOME 18: NEGATIVE
CHROMOSOME 21: NEGATIVE
Y Chromosome: DETECTED

## 2017-02-01 LAB — REQUEST PROBLEM

## 2017-02-16 ENCOUNTER — Ambulatory Visit (INDEPENDENT_AMBULATORY_CARE_PROVIDER_SITE_OTHER): Payer: Medicaid Other | Admitting: Obstetrics and Gynecology

## 2017-02-16 ENCOUNTER — Other Ambulatory Visit: Payer: Medicaid Other

## 2017-02-16 VITALS — BP 124/70 | HR 98 | Wt 210.2 lb

## 2017-02-16 DIAGNOSIS — Z3492 Encounter for supervision of normal pregnancy, unspecified, second trimester: Secondary | ICD-10-CM

## 2017-02-16 DIAGNOSIS — R638 Other symptoms and signs concerning food and fluid intake: Secondary | ICD-10-CM

## 2017-02-16 LAB — POCT URINALYSIS DIPSTICK
Bilirubin, UA: NEGATIVE
GLUCOSE UA: NEGATIVE
Ketones, UA: NEGATIVE
NITRITE UA: NEGATIVE
SPEC GRAV UA: 1.015 (ref 1.030–1.035)
UROBILINOGEN UA: 0.2 (ref ?–2.0)
pH, UA: 7 (ref 5.0–8.0)

## 2017-02-16 NOTE — Addendum Note (Signed)
Addended by: Rosine BeatLONTZ, AMY L on: 02/16/2017 11:57 AM   Modules accepted: Orders

## 2017-02-16 NOTE — Progress Notes (Signed)
ROB-doing well except constipation- OK to add miralax as needed. Early glucola done today. Anatomy scan next visit.

## 2017-02-16 NOTE — Progress Notes (Signed)
ROB-early glucola done, pt is doing well 

## 2017-02-17 LAB — GLUCOSE, 1 HOUR GESTATIONAL: GESTATIONAL DIABETES SCREEN: 99 mg/dL (ref 65–139)

## 2017-02-18 LAB — URINE CULTURE

## 2017-03-15 ENCOUNTER — Other Ambulatory Visit: Payer: Self-pay | Admitting: Certified Nurse Midwife

## 2017-03-15 DIAGNOSIS — Z369 Encounter for antenatal screening, unspecified: Secondary | ICD-10-CM

## 2017-03-16 ENCOUNTER — Ambulatory Visit (INDEPENDENT_AMBULATORY_CARE_PROVIDER_SITE_OTHER): Payer: Medicaid Other | Admitting: Certified Nurse Midwife

## 2017-03-16 ENCOUNTER — Ambulatory Visit (INDEPENDENT_AMBULATORY_CARE_PROVIDER_SITE_OTHER): Payer: Medicaid Other

## 2017-03-16 VITALS — BP 128/70 | HR 77 | Wt 212.4 lb

## 2017-03-16 DIAGNOSIS — R319 Hematuria, unspecified: Secondary | ICD-10-CM

## 2017-03-16 DIAGNOSIS — Z369 Encounter for antenatal screening, unspecified: Secondary | ICD-10-CM

## 2017-03-16 DIAGNOSIS — Z3482 Encounter for supervision of other normal pregnancy, second trimester: Secondary | ICD-10-CM | POA: Diagnosis not present

## 2017-03-16 LAB — POCT URINALYSIS DIPSTICK
Bilirubin, UA: NEGATIVE
Glucose, UA: NEGATIVE
KETONES UA: NEGATIVE
Nitrite, UA: NEGATIVE
PROTEIN UA: NEGATIVE
SPEC GRAV UA: 1.015 (ref 1.010–1.025)
UROBILINOGEN UA: NEGATIVE U/dL — AB
pH, UA: 7 (ref 5.0–8.0)

## 2017-03-16 NOTE — Patient Instructions (Signed)

## 2017-03-16 NOTE — Progress Notes (Signed)
ROB, doing well . No complaints. Anatomy scan today, reviewed results with pt.See below for results. She denies contractions, vaginal bleeding and discharge. She endorses good fetal movement. Reviewed fetal movement and correlation with fetal well being.  She verbalizes understanding . She will return in 4 wks .    ULTRASOUND REPORT  Location: [facility] Date of Service: 03/16/17  Indications:Anatomy U/S Findings:  Mason Jim intrauterine pregnancy is visualized with FHR at 147 BPM. Biometrics give an (U/S) Gestational age of 26 1/7 weeks and an (U/S) EDD of 08/02/17; this correlates with the clinically established EDD of 08/05/17.  Fetal presentation is Vertex.  EFW: 332g (12 oz). Placenta: Posterior, grade 0, 4.1 cm from internal os. AFI: Adequate with MVP of 3.4 cm.  Anatomic survey is complete and normal; Gender - female  .   Ovaries are not seen Survey of the adnexa demonstrates no adnexal masses. There is no free peritoneal fluid in the cul de sac.  Impression: 1. 20 1/7 week Viable Singleton Intrauterine pregnancy by U/S. 2. (U/S) EDD is consistent with Clinically established (LMP) EDD of 08/05/17. 3. Normal Anatomy Scan  Recommendations: 1.Clinical correlation with the patient's History and Physical Exam.   Boyce Medici

## 2017-03-18 LAB — CULTURE, OB URINE

## 2017-03-18 LAB — URINE CULTURE, OB REFLEX

## 2017-03-22 ENCOUNTER — Encounter: Payer: Self-pay | Admitting: Certified Nurse Midwife

## 2017-04-15 ENCOUNTER — Ambulatory Visit (INDEPENDENT_AMBULATORY_CARE_PROVIDER_SITE_OTHER): Payer: Medicaid Other | Admitting: Certified Nurse Midwife

## 2017-04-15 ENCOUNTER — Encounter: Payer: Self-pay | Admitting: Certified Nurse Midwife

## 2017-04-15 VITALS — BP 112/67 | HR 70 | Wt 214.8 lb

## 2017-04-15 DIAGNOSIS — Z131 Encounter for screening for diabetes mellitus: Secondary | ICD-10-CM

## 2017-04-15 DIAGNOSIS — K5900 Constipation, unspecified: Secondary | ICD-10-CM

## 2017-04-15 DIAGNOSIS — Z130 Encounter for screening for diseases of the blood and blood-forming organs and certain disorders involving the immune mechanism: Secondary | ICD-10-CM

## 2017-04-15 DIAGNOSIS — O99612 Diseases of the digestive system complicating pregnancy, second trimester: Secondary | ICD-10-CM | POA: Insufficient documentation

## 2017-04-15 DIAGNOSIS — Z3482 Encounter for supervision of other normal pregnancy, second trimester: Secondary | ICD-10-CM

## 2017-04-15 LAB — POCT URINALYSIS DIPSTICK
Bilirubin, UA: NEGATIVE
Blood, UA: NEGATIVE
Glucose, UA: NEGATIVE
KETONES UA: NEGATIVE
LEUKOCYTES UA: NEGATIVE
Nitrite, UA: NEGATIVE
PROTEIN UA: NEGATIVE
Spec Grav, UA: 1.015 (ref 1.010–1.025)
Urobilinogen, UA: NEGATIVE E.U./dL — AB
pH, UA: 7.5 (ref 5.0–8.0)

## 2017-04-15 MED ORDER — BISACODYL 5 MG PO TBEC: 5.0000 mg | DELAYED_RELEASE_TABLET | Freq: Every day | ORAL | 0 refills | Status: AC | PRN

## 2017-04-15 NOTE — Patient Instructions (Signed)
Common Medications Safe in Pregnancy  Acne:      Constipation:  Benzoyl Peroxide     Colace  Clindamycin      Dulcolax Suppository  Topica Erythromycin     Fibercon  Salicylic Acid      Metamucil         Miralax AVOID:        Senakot   Accutane    Cough:  Retin-A       Cough Drops  Tetracycline      Phenergan w/ Codeine if Rx  Minocycline      Robitussin (Plain & DM)  Antibiotics:     Crabs/Lice:  Ceclor       RID  Cephalosporins    AVOID:  E-Mycins      Kwell  Keflex  Macrobid/Macrodantin   Diarrhea:  Penicillin      Kao-Pectate  Zithromax      Imodium AD         PUSH FLUIDS AVOID:       Cipro     Fever:  Tetracycline      Tylenol (Regular or Extra  Minocycline       Strength)  Levaquin      Extra Strength-Do not          Exceed 8 tabs/24 hrs Caffeine:        <200mg/day (equiv. To 1 cup of coffee or  approx. 3 12 oz sodas)         Gas: Cold/Hayfever:       Gas-X  Benadryl      Mylicon  Claritin       Phazyme  **Claritin-D        Chlor-Trimeton    Headaches:  Dimetapp      ASA-Free Excedrin  Drixoral-Non-Drowsy     Cold Compress  Mucinex (Guaifenasin)     Tylenol (Regular or Extra  Sudafed/Sudafed-12 Hour     Strength)  **Sudafed PE Pseudoephedrine   Tylenol Cold & Sinus     Vicks Vapor Rub  Zyrtec  **AVOID if Problems With Blood Pressure         Heartburn: Avoid lying down for at least 1 hour after meals  Aciphex      Maalox     Rash:  Milk of Magnesia     Benadryl    Mylanta       1% Hydrocortisone Cream  Pepcid  Pepcid Complete   Sleep Aids:  Prevacid      Ambien   Prilosec       Benadryl  Rolaids       Chamomile Tea  Tums (Limit 4/day)     Unisom  Zantac       Tylenol PM         Warm milk-add vanilla or  Hemorrhoids:       Sugar for taste  Anusol/Anusol H.C.  (RX: Analapram 2.5%)  Sugar Substitutes:  Hydrocortisone OTC     Ok in moderation  Preparation H      Tucks        Vaseline lotion applied to tissue with  wiping    Herpes:     Throat:  Acyclovir      Oragel  Famvir  Valtrex     Vaccines:         Flu Shot Leg Cramps:       *Gardasil  Benadryl      Hepatitis A         Hepatitis B Nasal Spray:         Pneumovax  Saline Nasal Spray     Polio Booster         Tetanus Nausea:       Tuberculosis test or PPD  Vitamin B6 25 mg TID   AVOID:    Dramamine      *Gardasil  Emetrol       Live Poliovirus  Ginger Root 250 mg QID    MMR (measles, mumps &  High Complex Carbs @ Bedtime    rebella)  Sea Bands-Accupressure    Varicella (Chickenpox)  Unisom 1/2 tab TID     *No known complications           If received before Pain:         Known pregnancy;   Darvocet       Resume series after  Lortab        Delivery  Percocet    Yeast:   Tramadol      Femstat  Tylenol 3      Gyne-lotrimin  Ultram       Monistat  Vicodin           MISC:         All Sunscreens           Hair Coloring/highlights          Insect Repellant's          (Including DEET)         Mystic Tans Second Trimester of Pregnancy The second trimester is from week 13 through week 28, month 4 through 6. This is often the time in pregnancy that you feel your best. Often times, morning sickness has lessened or quit. You may have more energy, and you may get hungry more often. Your unborn baby (fetus) is growing rapidly. At the end of the sixth month, he or she is about 9 inches long and weighs about 1 pounds. You will likely feel the baby move (quickening) between 18 and 20 weeks of pregnancy. Follow these instructions at home:  Avoid all smoking, herbs, and alcohol. Avoid drugs not approved by your doctor.  Do not use any tobacco products, including cigarettes, chewing tobacco, and electronic cigarettes. If you need help quitting, ask your doctor. You may get counseling or other support to help you quit.  Only take medicine as told by your doctor. Some medicines are safe and some are not during pregnancy.  Exercise only as told by your  doctor. Stop exercising if you start having cramps.  Eat regular, healthy meals.  Wear a good support bra if your breasts are tender.  Do not use hot tubs, steam rooms, or saunas.  Wear your seat belt when driving.  Avoid raw meat, uncooked cheese, and liter boxes and soil used by cats.  Take your prenatal vitamins.  Take 1500-2000 milligrams of calcium daily starting at the 20th week of pregnancy until you deliver your baby.  Try taking medicine that helps you poop (stool softener) as needed, and if your doctor approves. Eat more fiber by eating fresh fruit, vegetables, and whole grains. Drink enough fluids to keep your pee (urine) clear or pale yellow.  Take warm water baths (sitz baths) to soothe pain or discomfort caused by hemorrhoids. Use hemorrhoid cream if your doctor approves.  If you have puffy, bulging veins (varicose veins), wear support hose. Raise (elevate) your feet for 15 minutes, 3-4 times a day. Limit salt in your diet.  Avoid heavy lifting, wear low heals, and sit up straight.    Rest with your legs raised if you have leg cramps or low back pain.  Visit your dentist if you have not gone during your pregnancy. Use a soft toothbrush to brush your teeth. Be gentle when you floss.  You can have sex (intercourse) unless your doctor tells you not to.  Go to your doctor visits. Get help if:  You feel dizzy.  You have mild cramps or pressure in your lower belly (abdomen).  You have a nagging pain in your belly area.  You continue to feel sick to your stomach (nauseous), throw up (vomit), or have watery poop (diarrhea).  You have bad smelling fluid coming from your vagina.  You have pain with peeing (urination). Get help right away if:  You have a fever.  You are leaking fluid from your vagina.  You have spotting or bleeding from your vagina.  You have severe belly cramping or pain.  You lose or gain weight rapidly.  You have trouble catching your  breath and have chest pain.  You notice sudden or extreme puffiness (swelling) of your face, hands, ankles, feet, or legs.  You have not felt the baby move in over an hour.  You have severe headaches that do not go away with medicine.  You have vision changes. This information is not intended to replace advice given to you by your health care provider. Make sure you discuss any questions you have with your health care provider. Document Released: 02/10/2010 Document Revised: 04/23/2016 Document Reviewed: 01/17/2013 Elsevier Interactive Patient Education  2017 [employer]  

## 2017-04-16 NOTE — Progress Notes (Signed)
ROB-Pt doing well, reports constipation requiring MiraLax biweekly, daily colace, and women's gentle laxative. Requesting Rx for laxative, see orders. Reviewed red flag symptoms and when to call. RTC x 4 weeks for glucola and ROB. Dorena DewMarilyn Steele, [employer] pregnancy case manager, here to speak with patient after appointment.

## 2017-05-13 ENCOUNTER — Encounter: Payer: Self-pay | Admitting: Certified Nurse Midwife

## 2017-05-13 ENCOUNTER — Other Ambulatory Visit: Payer: Medicaid Other

## 2017-05-13 ENCOUNTER — Ambulatory Visit (INDEPENDENT_AMBULATORY_CARE_PROVIDER_SITE_OTHER): Payer: Medicaid Other | Admitting: Certified Nurse Midwife

## 2017-05-13 VITALS — BP 109/65 | HR 84 | Wt 220.8 lb

## 2017-05-13 DIAGNOSIS — Z23 Encounter for immunization: Secondary | ICD-10-CM | POA: Diagnosis not present

## 2017-05-13 DIAGNOSIS — Z3482 Encounter for supervision of other normal pregnancy, second trimester: Secondary | ICD-10-CM

## 2017-05-13 DIAGNOSIS — R82998 Other abnormal findings in urine: Secondary | ICD-10-CM

## 2017-05-13 DIAGNOSIS — Z130 Encounter for screening for diseases of the blood and blood-forming organs and certain disorders involving the immune mechanism: Secondary | ICD-10-CM

## 2017-05-13 DIAGNOSIS — Z131 Encounter for screening for diabetes mellitus: Secondary | ICD-10-CM

## 2017-05-13 DIAGNOSIS — R319 Hematuria, unspecified: Secondary | ICD-10-CM

## 2017-05-13 DIAGNOSIS — R8299 Other abnormal findings in urine: Secondary | ICD-10-CM

## 2017-05-13 DIAGNOSIS — Z3483 Encounter for supervision of other normal pregnancy, third trimester: Secondary | ICD-10-CM

## 2017-05-13 LAB — POCT URINALYSIS DIPSTICK
BILIRUBIN UA: NEGATIVE
Glucose, UA: NEGATIVE
KETONES UA: NEGATIVE
Nitrite, UA: NEGATIVE
PH UA: 7 (ref 5.0–8.0)
PROTEIN UA: NEGATIVE
SPEC GRAV UA: 1.01 (ref 1.010–1.025)
Urobilinogen, UA: 0.2 E.U./dL

## 2017-05-13 NOTE — Patient Instructions (Addendum)
Tdap Vaccine (Tetanus, Diphtheria and Pertussis): What You Need to Know 1. Why get vaccinated? Tetanus, diphtheria and pertussis are very serious diseases. Tdap vaccine can protect us from these diseases. And, Tdap vaccine given to pregnant women can protect newborn babies against pertussis. TETANUS (Lockjaw) is rare in the [place] today. It causes painful muscle tightening and stiffness, usually all over the body.  It can lead to tightening of muscles in the head and neck so you can't open your mouth, swallow, or sometimes even breathe. Tetanus kills about 1 out of 10 people who are infected even after receiving the best medical care.  DIPHTHERIA is also rare in the [place] today. It can cause a thick coating to form in the back of the throat.  It can lead to breathing problems, heart failure, paralysis, and death.  PERTUSSIS (Whooping Cough) causes severe coughing spells, which can cause difficulty breathing, vomiting and disturbed sleep.  It can also lead to weight loss, incontinence, and rib fractures. Up to 2 in 100 adolescents and 5 in 100 adults with pertussis are hospitalized or have complications, which could include pneumonia or death.  These diseases are caused by bacteria. Diphtheria and pertussis are spread from person to person through secretions from coughing or sneezing. Tetanus enters the body through cuts, scratches, or wounds. Before vaccines, as many as 200,000 cases of diphtheria, 200,000 cases of pertussis, and hundreds of cases of tetanus, were reported in the [place] each year. Since vaccination began, reports of cases for tetanus and diphtheria have dropped by about 99% and for pertussis by about 80%. 2. Tdap vaccine Tdap vaccine can protect adolescents and adults from tetanus, diphtheria, and pertussis. One dose of Tdap is routinely given at age 11 or 12. People who did not get Tdap at that age should get it as soon as possible. Tdap is especially  important for healthcare professionals and anyone having close contact with a baby younger than 12 months. Pregnant women should get a dose of Tdap during every pregnancy, to protect the newborn from pertussis. Infants are most at risk for severe, life-threatening complications from pertussis. Another vaccine, called Td, protects against tetanus and diphtheria, but not pertussis. A Td booster should be given every 10 years. Tdap may be given as one of these boosters if you have never gotten Tdap before. Tdap may also be given after a severe cut or burn to prevent tetanus infection. Your doctor or the person giving you the vaccine can give you more information. Tdap may safely be given at the same time as other vaccines. 3. Some people should not get this vaccine  A person who has ever had a life-threatening allergic reaction after a previous dose of any diphtheria, tetanus or pertussis containing vaccine, OR has a severe allergy to any part of this vaccine, should not get Tdap vaccine. Tell the person giving the vaccine about any severe allergies.  Anyone who had coma or long repeated seizures within 7 days after a childhood dose of DTP or DTaP, or a previous dose of Tdap, should not get Tdap, unless a cause other than the vaccine was found. They can still get Td.  Talk to your doctor if you: ? have seizures or another nervous system problem, ? had severe pain or swelling after any vaccine containing diphtheria, tetanus or pertussis, ? ever had a condition called Guillain-Barr Syndrome (GBS), ? aren't feeling well on the day the shot is scheduled. 4. Risks With any medicine, including   vaccines, there is a chance of side effects. These are usually mild and go away on their own. Serious reactions are also possible but are rare. Most people who get Tdap vaccine do not have any problems with it. Mild problems following Tdap: (Did not interfere with activities)  Pain where the shot was given (about  3 in 4 adolescents or 2 in 3 adults)  Redness or swelling where the shot was given (about 1 person in 5)  Mild fever of at least 100.4F (up to about 1 in 25 adolescents or 1 in 100 adults)  Headache (about 3 or 4 people in 10)  Tiredness (about 1 person in 3 or 4)  Nausea, vomiting, diarrhea, stomach ache (up to 1 in 4 adolescents or 1 in 10 adults)  Chills, sore joints (about 1 person in 10)  Body aches (about 1 person in 3 or 4)  Rash, swollen glands (uncommon)  Moderate problems following Tdap: (Interfered with activities, but did not require medical attention)  Pain where the shot was given (up to 1 in 5 or 6)  Redness or swelling where the shot was given (up to about 1 in 16 adolescents or 1 in 12 adults)  Fever over 102F (about 1 in 100 adolescents or 1 in 250 adults)  Headache (about 1 in 7 adolescents or 1 in 10 adults)  Nausea, vomiting, diarrhea, stomach ache (up to 1 or 3 people in 100)  Swelling of the entire arm where the shot was given (up to about 1 in 500).  Severe problems following Tdap: (Unable to perform usual activities; required medical attention)  Swelling, severe pain, bleeding and redness in the arm where the shot was given (rare).  Problems that could happen after any vaccine:  People sometimes faint after a medical procedure, including vaccination. Sitting or lying down for about 15 minutes can help prevent fainting, and injuries caused by a fall. Tell your doctor if you feel dizzy, or have vision changes or ringing in the ears.  Some people get severe pain in the shoulder and have difficulty moving the arm where a shot was given. This happens very rarely.  Any medication can cause a severe allergic reaction. Such reactions from a vaccine are very rare, estimated at fewer than 1 in a million doses, and would happen within a few minutes to a few hours after the vaccination. As with any medicine, there is a very remote chance of a vaccine  causing a serious injury or death. The safety of vaccines is always being monitored. For more information, visit: www.cdc.gov/vaccinesafety/ 5. What if there is a serious problem? What should I look for? Look for anything that concerns you, such as signs of a severe allergic reaction, very high fever, or unusual behavior. Signs of a severe allergic reaction can include hives, swelling of the face and throat, difficulty breathing, a fast heartbeat, dizziness, and weakness. These would usually start a few minutes to a few hours after the vaccination. What should I do?  If you think it is a severe allergic reaction or other emergency that can't wait, call 9-1-1 or get the person to the nearest hospital. Otherwise, call your doctor.  Afterward, the reaction should be reported to the Vaccine Adverse Event Reporting System (VAERS). Your doctor might file this report, or you can do it yourself through the VAERS web site at www.vaers.hhs.gov, or by calling 1-800-822-7967. ? VAERS does not give medical advice. 6. The National Vaccine Injury Compensation Program The National   Vaccine Injury Compensation Program (VICP) is a federal program that was created to compensate people who may have been injured by certain vaccines. Persons who believe they may have been injured by a vaccine can learn about the program and about filing a claim by calling 1-878 869 2136 or visiting the VICP website at SpiritualWord.at. There is a time limit to file a claim for compensation. 7. How can I learn more?  Ask your doctor. He or she can give you the vaccine package insert or suggest other sources of information.  Call your local or state health department.  Contact the Centers for Disease Control and Prevention (CDC): ? Call (416)197-9289 (1-800-CDC-INFO) or ? Visit CDC's website at PicCapture.uy CDC Tdap Vaccine VIS (01/23/14) This information is not intended to replace advice given to you by your  health care provider. Make sure you discuss any questions you have with your health care provider. Document Released: 05/17/2012 Document Revised: 08/06/2016 Document Reviewed: 08/06/2016 Elsevier Interactive Patient Education  2017 Elsevier Inc.  Cord Blood Banking Information Cord blood banking is the process of collecting and storing the blood that is in the umbilical cord and placenta at the time of delivery. This blood contains stem cells, which can be used to treat many blood diseases, immune system disorders, and childhood cancers. Stem cells can also be used to research certain diseases and treatments. Many people who choose cord blood banking donate the blood. Donated blood can be used in lifesaving treatments or for research. Other people choose to store the blood privately. Blood that is stored privately can only be used with the person's permission. This option is often chosen if:  A family member needs a stem cell transplant.  The child is part of an ethnic minority.  The child was conceived through in vitro fertilization.  What should I look for in a blood bank? A blood bank is the organization that coordinates cord blood banking. Make sure the cord blood bank that you use:  Is accredited.  Is financially stable.  Handles a large volume of cord blood samples.  Has a procedure in place for transport and storage.  Allows you the option of transferring your cord blood sample.  Has a procedure in place if the bank goes out of business.  Clearly states all costs and limits to future costs.  People who choose to donate cord blood should not need to pay for blood banking. People who keep the blood for private use will need to pay for the first (initial) storage and pay a fee each year (annual fee). Other fees may also apply. What are the risks of cord blood banking? There are no health risks associated with cord blood banking. It is considered safe. How should I  prepare? You must schedule this process at least 4-6 weeks before you will be giving birth. How is the blood collected? The blood is collected as soon as the baby has been delivered. Within 15 minutes of delivery, a health care provider will take these actions to collect the blood:  Clamp the umbilical cord at the top and bottom. This traps the blood in the umbilical cord.  Use a syringe or bag to collect the blood.  Insert needles into the placenta to collect (draw out) more blood.  What happens after the blood is collected? After the blood has been collected:  The blood will be sent to a blood bank.  The blood will be tested for genetic problems and infectious diseases. If the blood  tests positive for a genetic problem or a disease, someone will contact you and let you know.  The blood will be frozen.  If your child develops a genetic condition, immune system disorder, or cancer, you will be responsible for contacting the blood bank and letting them know. This information is not intended to replace advice given to you by your health care provider. Make sure you discuss any questions you have with your health care provider. Document Released: 05/06/2010 Document Revised: 04/23/2016 Document Reviewed: 05/06/2015 Elsevier Interactive Patient Education  2018 [employer]. [facility] 2018 Prenatal Education Class Schedule Register at [email or web address] in the Classes & Resources Link or call Mardi Mainland at [phone] 9:00a-5:00p M-F  Childbirth Preparation Certified Childbirth Educators teach this 5 week course.  Expectant parents are encouraged to take this class in their 3rd trimester, completing it by their 35-36th week. Meets in [facility], Lower Level.  Mondays Thursdays  7:00-9:00 p 7:00-9:00 p  July 23 - August 20 July 19 - August 16  September 17 - October 15 September 6 -October 4  November 5 - December 3 October 25 - November 29   No Class  on Thanksgiving Day -November 22  Childbirth Preparation Refresher Course For those who have previously attended Prepared Childbirth Preparation classes, this class in incorporated into the 3rd and 4th classes in the Monday night childbirth series.  Course meets in the [facility]. Lower Level from 7:00p - 9:00p  August 6 & 13  October 1 & 8  November 19 & 26   Weekend Childbirth Aundria Mems Classes are held Saturday & Sunday, 1:00 5:00p Course meets in [facility], [place] Level  August 4 & 5  November 3 & 4    The [address] Free tours are held on the third Sunday of each month at 3 pm.  The tour meets in the third floor waiting area and will take approximately 30 minutes.  Tours are also included in Childbirth class series as well as Brother/Sister class.  An online virtual tour can be seen at [email or web address]         Breastfeeding & Infant Nutrition The course incorporates returning to work or school.  Breast milk collection and storage with basic breastfeeding and infant nutrition. This two-class course is held the 2nd and 3rd Tuesday of each month from 7:00 -9:00 pm.  Course meets in the [facility] Lower level  June 12 & 19 July-No Class  August 14 & 21 September 11 &18  September 11 & 18 October 9 &16  November 13 & 20 December 11 & 18   [employer] welcomes any mother for a social outing with other Moms to share experiences and challenges in an informal setting.  Meets the 1st Thursday and 3rd Thursday 11:30a-1:00 pm of each month in [facility] 3rd floor classroom.  No registration required.  Newborn Essentials This course covers bathing, diapering, swaddling and more with practice on lifelike dolls.  Participants will also learn safety tips and infant CPR (Not for certification).  It is held the 1st Wednesday of each month from 7:00p-9:00p in the [facility], Lower level.  June 6 July- No Class  August 1  September 5  October 3 November 7  December 7    Preparing Big Brother & Sister This one session course prepares children and their parents for the arrival of a new baby.  It is held on the 1st Tuesday of each  month from 6:30p - 8:00p. Course meets in the [facility], Lower level.  July-No Class August 7  September 4 October 2  November 6 December 4   [place] [employer]  Dads This nationally acclaimed class helps expecting and new dads with the basic skills and confidence to bond with their infants, support their mates, and provide a safe and healthy home environment for their new family. Classes are held the 2nd Saturday of every month from 9:00a - 12:00 noon.  Course meets in the [facility] Lower level.  June 9 August 11  October 13 No Class in December   Third Trimester of Pregnancy The third trimester is from week 28 through week 40 (months 7 through 9). The third trimester is a time when the unborn baby (fetus) is growing rapidly. At the end of the ninth month, the fetus is about 20 inches in length and weighs 6-10 pounds. Body changes during your third trimester Your body will continue to go through many changes during pregnancy. The changes vary from woman to woman. During the third trimester:  Your weight will continue to increase. You can expect to gain 25-35 pounds (11-16 kg) by the end of the pregnancy.  You may begin to get stretch marks on your hips, abdomen, and breasts.  You may urinate more often because the fetus is moving lower into your pelvis and pressing on your bladder.  You may develop or continue to have heartburn. This is caused by increased hormones that slow down muscles in the digestive tract.  You may develop or continue to have constipation because increased hormones slow digestion and cause the muscles that push waste through your intestines to relax.  You may develop hemorrhoids. These are swollen veins (varicose veins) in the rectum  that can itch or be painful.  You may develop swollen, bulging veins (varicose veins) in your legs.  You may have increased body aches in the pelvis, back, or thighs. This is due to weight gain and increased hormones that are relaxing your joints.  You may have changes in your hair. These can include thickening of your hair, rapid growth, and changes in texture. Some women also have hair loss during or after pregnancy, or hair that feels dry or thin. Your hair will most likely return to normal after your baby is born.  Your breasts will continue to grow and they will continue to become tender. A yellow fluid (colostrum) may leak from your breasts. This is the first milk you are producing for your baby.  Your belly button may stick out.  You may notice more swelling in your hands, face, or ankles.  You may have increased tingling or numbness in your hands, arms, and legs. The skin on your belly may also feel numb.  You may feel short of breath because of your expanding uterus.  You may have more problems sleeping. This can be caused by the size of your belly, increased need to urinate, and an increase in your body's metabolism.  You may notice the fetus "dropping," or moving lower in your abdomen (lightening).  You may have increased vaginal discharge.  You may notice your joints feel loose and you may have pain around your pelvic bone.  What to expect at prenatal visits You will have prenatal exams every 2 weeks until week 36. Then you will have weekly prenatal exams. During a routine prenatal visit:  You will be weighed to make sure you and the baby  are growing normally.  Your blood pressure will be taken.  Your abdomen will be measured to track your baby's growth.  The fetal heartbeat will be listened to.  Any test results from the previous visit will be discussed.  You may have a cervical check near your due date to see if your cervix has softened or thinned  (effaced).  You will be tested for Group B streptococcus. This happens between 35 and 37 weeks.  Your health care provider may ask you:  What your birth plan is.  How you are feeling.  If you are feeling the baby move.  If you have had any abnormal symptoms, such as leaking fluid, bleeding, severe headaches, or abdominal cramping.  If you are using any tobacco products, including cigarettes, chewing tobacco, and electronic cigarettes.  If you have any questions.  Other tests or screenings that may be performed during your third trimester include:  Blood tests that check for low iron levels (anemia).  Fetal testing to check the health, activity level, and growth of the fetus. Testing is done if you have certain medical conditions or if there are problems during the pregnancy.  Nonstress test (NST). This test checks the health of your baby to make sure there are no signs of problems, such as the baby not getting enough oxygen. During this test, a belt is placed around your belly. The baby is made to move, and its heart rate is monitored during movement.  What is false labor? False labor is a condition in which you feel small, irregular tightenings of the muscles in the womb (contractions) that usually go away with rest, changing position, or drinking water. These are called Braxton Hicks contractions. Contractions may last for hours, days, or even weeks before true labor sets in. If contractions come at regular intervals, become more frequent, increase in intensity, or become painful, you should see your health care provider. What are the signs of labor?  Abdominal cramps.  Regular contractions that start at 10 minutes apart and become stronger and more frequent with time.  Contractions that start on the top of the uterus and spread down to the lower abdomen and back.  Increased pelvic pressure and dull back pain.  A watery or bloody mucus discharge that comes from the  vagina.  Leaking of amniotic fluid. This is also known as your "water breaking." It could be a slow trickle or a gush. Let your health care provider know if it has a color or strange odor. If you have any of these signs, call your health care provider right away, even if it is before your due date. Follow these instructions at home: Medicines  Follow your health care provider's instructions regarding medicine use. Specific medicines may be either safe or unsafe to take during pregnancy.  Take a prenatal vitamin that contains at least 600 micrograms (mcg) of folic acid.  If you develop constipation, try taking a stool softener if your health care provider approves. Eating and drinking  Eat a balanced diet that includes fresh fruits and vegetables, whole grains, good sources of protein such as meat, eggs, or tofu, and low-fat dairy. Your health care provider will help you determine the amount of weight gain that is right for you.  Avoid raw meat and uncooked cheese. These carry germs that can cause birth defects in the baby.  If you have low calcium intake from food, talk to your health care provider about whether you should take a daily calcium  supplement.  Eat four or five small meals rather than three large meals a day.  Limit foods that are high in fat and processed sugars, such as fried and sweet foods.  To prevent constipation: ? Drink enough fluid to keep your urine clear or pale yellow. ? Eat foods that are high in fiber, such as fresh fruits and vegetables, whole grains, and beans. Activity  Exercise only as directed by your health care provider. Most women can continue their usual exercise routine during pregnancy. Try to exercise for 30 minutes at least 5 days a week. Stop exercising if you experience uterine contractions.  Avoid heavy lifting.  Do not exercise in extreme heat or humidity, or at high altitudes.  Wear low-heel, comfortable shoes.  Practice good  posture.  You may continue to have sex unless your health care provider tells you otherwise. Relieving pain and discomfort  Take frequent breaks and rest with your legs elevated if you have leg cramps or low back pain.  Take warm sitz baths to soothe any pain or discomfort caused by hemorrhoids. Use hemorrhoid cream if your health care provider approves.  Wear a good support bra to prevent discomfort from breast tenderness.  If you develop varicose veins: ? Wear support pantyhose or compression stockings as told by your healthcare provider. ? Elevate your feet for 15 minutes, 3-4 times a day. Prenatal care  Write down your questions. Take them to your prenatal visits.  Keep all your prenatal visits as told by your health care provider. This is important. Safety  Wear your seat belt at all times when driving.  Make a list of emergency phone numbers, including numbers for family, friends, the hospital, and police and fire departments. General instructions  Avoid cat litter boxes and soil used by cats. These carry germs that can cause birth defects in the baby. If you have a cat, ask someone to clean the litter box for you.  Do not travel far distances unless it is absolutely necessary and only with the approval of your health care provider.  Do not use hot tubs, steam rooms, or saunas.  Do not drink alcohol.  Do not use any products that contain nicotine or tobacco, such as cigarettes and e-cigarettes. If you need help quitting, ask your health care provider.  Do not use any medicinal herbs or unprescribed drugs. These chemicals affect the formation and growth of the baby.  Do not douche or use tampons or scented sanitary pads.  Do not cross your legs for long periods of time.  To prepare for the arrival of your baby: ? Take prenatal classes to understand, practice, and ask questions about labor and delivery. ? Make a trial run to the hospital. ? Visit the hospital and tour  the maternity area. ? Arrange for maternity or paternity leave through employers. ? Arrange for family and friends to take care of pets while you are in the hospital. ? Purchase a rear-facing car seat and make sure you know how to install it in your car. ? Pack your hospital bag. ? Prepare the baby's nursery. Make sure to remove all pillows and stuffed animals from the baby's crib to prevent suffocation.  Visit your dentist if you have not gone during your pregnancy. Use a soft toothbrush to brush your teeth and be gentle when you floss. Contact a health care provider if:  You are unsure if you are in labor or if your water has broken.  You become dizzy.  You have mild pelvic cramps, pelvic pressure, or nagging pain in your abdominal area.  You have lower back pain.  You have persistent nausea, vomiting, or diarrhea.  You have an unusual or bad smelling vaginal discharge.  You have pain when you urinate. Get help right away if:  Your water breaks before 37 weeks.  You have regular contractions less than 5 minutes apart before 37 weeks.  You have a fever.  You are leaking fluid from your vagina.  You have spotting or bleeding from your vagina.  You have severe abdominal pain or cramping.  You have rapid weight loss or weight gain.  You have shortness of breath with chest pain.  You notice sudden or extreme swelling of your face, hands, ankles, feet, or legs.  Your baby makes fewer than 10 movements in 2 hours.  You have severe headaches that do not go away when you take medicine.  You have vision changes. Summary  The third trimester is from week 28 through week 40, months 7 through 9. The third trimester is a time when the unborn baby (fetus) is growing rapidly.  During the third trimester, your discomfort may increase as you and your baby continue to gain weight. You may have abdominal, leg, and back pain, sleeping problems, and an increased need to  urinate.  During the third trimester your breasts will keep growing and they will continue to become tender. A yellow fluid (colostrum) may leak from your breasts. This is the first milk you are producing for your baby.  False labor is a condition in which you feel small, irregular tightenings of the muscles in the womb (contractions) that eventually go away. These are called Braxton Hicks contractions. Contractions may last for hours, days, or even weeks before true labor sets in.  Signs of labor can include: abdominal cramps; regular contractions that start at 10 minutes apart and become stronger and more frequent with time; watery or bloody mucus discharge that comes from the vagina; increased pelvic pressure and dull back pain; and leaking of amniotic fluid. This information is not intended to replace advice given to you by your health care provider. Make sure you discuss any questions you have with your health care provider. Document Released: 11/10/2001 Document Revised: 04/23/2016 Document Reviewed: 01/17/2013 Elsevier Interactive Patient Education  2017 [employer]

## 2017-05-13 NOTE — Progress Notes (Signed)
ROB-Pt doing well, no questions or concerns.TDAP given. Glucola and H/H today. Blood transfusion consent and BTL consent discussed and signed. Discussed pain management options in labor. Childbirth class schedule given-sibling tour encouraged. Plans formula feeding-Enfamil, epidural, skin to skin, delayed cord clamping, postpartum BTL, and [facility] for pediatric care and circumcision. Reviewed red flag symptoms and when to call RTC x 2 weeks for ROB or sooner if needed.

## 2017-05-14 LAB — GLUCOSE TOLERANCE, 1 HOUR: GLUCOSE, 1HR PP: 111 mg/dL (ref 65–199)

## 2017-05-14 LAB — HEMOGLOBIN AND HEMATOCRIT, BLOOD
Hematocrit: 30.2 % — ABNORMAL LOW (ref 34.0–46.6)
Hemoglobin: 9.9 g/dL — ABNORMAL LOW (ref 11.1–15.9)

## 2017-05-15 LAB — URINE CULTURE, OB REFLEX

## 2017-05-15 LAB — CULTURE, OB URINE

## 2017-05-28 ENCOUNTER — Encounter: Payer: Self-pay | Admitting: Certified Nurse Midwife

## 2017-05-28 ENCOUNTER — Ambulatory Visit (INDEPENDENT_AMBULATORY_CARE_PROVIDER_SITE_OTHER): Payer: Medicaid Other | Admitting: Certified Nurse Midwife

## 2017-05-28 VITALS — BP 116/56 | HR 68 | Wt 220.4 lb

## 2017-05-28 DIAGNOSIS — Z3493 Encounter for supervision of normal pregnancy, unspecified, third trimester: Secondary | ICD-10-CM

## 2017-05-28 LAB — POCT URINALYSIS DIPSTICK
BILIRUBIN UA: NEGATIVE
Glucose, UA: NEGATIVE
Ketones, UA: NEGATIVE
Leukocytes, UA: NEGATIVE
NITRITE UA: NEGATIVE
PH UA: 6 (ref 5.0–8.0)
Protein, UA: NEGATIVE
RBC UA: NEGATIVE
Spec Grav, UA: 1.01 (ref 1.010–1.025)
UROBILINOGEN UA: 0.2 U/dL

## 2017-05-28 NOTE — Progress Notes (Signed)
ROB, doing well. States that she has swelling on her left leg. Discussed use of compression stocking, increase in water , and elevation of legs. Also discussed Antenatal testing NST at 34 wks, and u/s for growth at 36 wks. Reviewed fetal movement. Follow up in 2 wks.   Doreene BurkeAnnie Trevino Wyatt, CNM

## 2017-05-28 NOTE — Patient Instructions (Signed)

## 2017-05-28 NOTE — Progress Notes (Signed)
Pt is here for an OB visit. 

## 2017-06-11 ENCOUNTER — Ambulatory Visit (INDEPENDENT_AMBULATORY_CARE_PROVIDER_SITE_OTHER): Payer: Medicaid Other | Admitting: Certified Nurse Midwife

## 2017-06-11 ENCOUNTER — Encounter: Payer: Self-pay | Admitting: Certified Nurse Midwife

## 2017-06-11 VITALS — BP 111/61 | HR 78 | Wt 220.7 lb

## 2017-06-11 DIAGNOSIS — N898 Other specified noninflammatory disorders of vagina: Secondary | ICD-10-CM

## 2017-06-11 DIAGNOSIS — Z3493 Encounter for supervision of normal pregnancy, unspecified, third trimester: Secondary | ICD-10-CM

## 2017-06-11 DIAGNOSIS — R319 Hematuria, unspecified: Secondary | ICD-10-CM

## 2017-06-11 DIAGNOSIS — O26893 Other specified pregnancy related conditions, third trimester: Secondary | ICD-10-CM

## 2017-06-11 LAB — POCT URINALYSIS DIPSTICK
Bilirubin, UA: NEGATIVE
Glucose, UA: NEGATIVE
KETONES UA: NEGATIVE
Nitrite, UA: NEGATIVE
PH UA: 8 (ref 5.0–8.0)
SPEC GRAV UA: 1.01 (ref 1.010–1.025)
Urobilinogen, UA: 0.2 E.U./dL

## 2017-06-11 MED ORDER — METRONIDAZOLE 500 MG PO TABS: 500.0000 mg | ORAL_TABLET | Freq: Two times a day (BID) | ORAL | 0 refills | Status: AC

## 2017-06-11 NOTE — Addendum Note (Signed)
Addended by: Marchelle FolksMILLER, Anushka Hartinger G on: 06/11/2017 03:30 PM   Modules accepted: Orders

## 2017-06-11 NOTE — Patient Instructions (Signed)
Fetal Movement Counts Patient Name: ________________________________________________ Patient Due Date: ____________________ What is a fetal movement count? A fetal movement count is the number of times that you feel your baby move during a certain amount of time. This may also be called a fetal kick count. A fetal movement count is recommended for every pregnant woman. You may be asked to start counting fetal movements as early as week 28 of your pregnancy. Pay attention to when your baby is most active. You may notice your baby's sleep and wake cycles. You may also notice things that make your baby move more. You should do a fetal movement count:  When your baby is normally most active.  At the same time each day.  A good time to count movements is while you are resting, after having something to eat and drink. How do I count fetal movements? 1. Find a quiet, comfortable area. Sit, or lie down on your side. 2. Write down the date, the start time and stop time, and the number of movements that you felt between those two times. Take this information with you to your health care visits. 3. For 2 hours, count kicks, flutters, swishes, rolls, and jabs. You should feel at least 10 movements during 2 hours. 4. You may stop counting after you have felt 10 movements. 5. If you do not feel 10 movements in 2 hours, have something to eat and drink. Then, keep resting and counting for 1 hour. If you feel at least 4 movements during that hour, you may stop counting. Contact a health care provider if:  You feel fewer than 4 movements in 2 hours.  Your baby is not moving like he or she usually does. Date: ____________ Start time: ____________ Stop time: ____________ Movements: ____________ Date: ____________ Start time: ____________ Stop time: ____________ Movements: ____________ Date: ____________ Start time: ____________ Stop time: ____________ Movements: ____________ Date: ____________ Start time:  ____________ Stop time: ____________ Movements: ____________ Date: ____________ Start time: ____________ Stop time: ____________ Movements: ____________ Date: ____________ Start time: ____________ Stop time: ____________ Movements: ____________ Date: ____________ Start time: ____________ Stop time: ____________ Movements: ____________ Date: ____________ Start time: ____________ Stop time: ____________ Movements: ____________ Date: ____________ Start time: ____________ Stop time: ____________ Movements: ____________ This information is not intended to replace advice given to you by your health care provider. Make sure you discuss any questions you have with your health care provider. Document Released: 12/16/2006 Document Revised: 07/15/2016 Document Reviewed: 12/26/2015 Elsevier Interactive Patient Education  2018 [employer] Nonstress Test The nonstress test is a procedure that monitors the fetus's heartbeat. The test will monitor the heartbeat when the fetus is at rest and while the fetus is moving. In a healthy fetus, there will be an increase in fetal heart rate when the fetus moves or kicks. The heart rate will decrease at rest. This test helps determine if the fetus is healthy. Your health care provider will look at a number of patterns in the heart rate tracing to make sure your baby is thriving. If there is concern, your health care provider may order additional tests or may suggest another course of action. This test is often done in the third trimester and can help determine if an early delivery is needed and safe. Common reasons to have this test are:  You are past your due date.  You have a high-risk pregnancy.  You are feeling less movement than normal.  You have lost a pregnancy in the past.  Your health   care provider suspects fetal growth problems.  You have too much or too little amniotic fluid.  What happens before the procedure?  Eat a meal right before the test or as  directed by your health care provider. Food may help stimulate fetal movements.  Use the restroom right before the test. What happens during the procedure?  Two belts will be placed around your abdomen. These belts have monitors attached to them. One records the fetal heart rate and the other records uterine contractions.  You may be asked to lie down on your side or to stay sitting upright.  You may be given a button to press when you feel movement.  The fetal heartbeat is listened to and watched on a screen. The heartbeat is recorded on a sheet of paper.  If the fetus seems to be sleeping, you may be asked to drink some juice or soda, gently press your abdomen, or make some noise to wake the fetus. What happens after the procedure? Your health care provider will discuss the test results with you and make recommendations for the near future.  This information is not intended to replace advice given to you by your health care provider. Make sure you discuss any questions you have with your health care provider. This information is not intended to replace advice given to you by your health care provider. Make sure you discuss any questions you have with your health care provider. Document Released: 11/06/2002 Document Revised: 10/16/2016 Document Reviewed: 12/20/2012 Elsevier Interactive Patient Education  2018 Elsevier Inc.  

## 2017-06-11 NOTE — Progress Notes (Signed)
ROB-Discharge with foul odor. Clear- x 1 week. NO itching or burning.

## 2017-06-11 NOTE — Progress Notes (Signed)
ROB-Pt doing well, reports malodorous vaginal discharge x 1 weeks. NuSwab collected, will contact patient with results. Positive whiff test. Rx: Flagyl, see orders. NAS brochure reviewed and given. Anticipiatory guidance regarding antenatal testing, growth US, and course of prenatal care. Reviewed red flag symptoms and when to call. RTC x 2 weeks for NST and ROB.

## 2017-06-13 LAB — URINE CULTURE

## 2017-06-17 LAB — NUSWAB BV AND CANDIDA, NAA
Atopobium vaginae: HIGH Score — AB
BVAB 2: HIGH Score — AB
Candida albicans, NAA: NEGATIVE
Candida glabrata, NAA: NEGATIVE
Megasphaera 1: HIGH Score — AB

## 2017-06-24 ENCOUNTER — Ambulatory Visit (INDEPENDENT_AMBULATORY_CARE_PROVIDER_SITE_OTHER): Payer: Medicaid Other | Admitting: Obstetrics and Gynecology

## 2017-06-24 ENCOUNTER — Ambulatory Visit: Payer: Medicaid Other

## 2017-06-24 VITALS — BP 119/57 | HR 76 | Wt 224.4 lb

## 2017-06-24 DIAGNOSIS — Z3493 Encounter for supervision of normal pregnancy, unspecified, third trimester: Secondary | ICD-10-CM

## 2017-06-24 LAB — POCT URINALYSIS DIPSTICK
Bilirubin, UA: NEGATIVE
Glucose, UA: NEGATIVE
Ketones, UA: NEGATIVE
Nitrite, UA: NEGATIVE
PH UA: 6.5 (ref 5.0–8.0)
Protein, UA: NEGATIVE
RBC UA: NEGATIVE
SPEC GRAV UA: 1.015 (ref 1.010–1.025)
UROBILINOGEN UA: 0.2 U/dL

## 2017-06-24 NOTE — Progress Notes (Signed)
ROB & NST- pt is doing well 

## 2017-06-24 NOTE — Progress Notes (Signed)
ROB with NST- NST performed today was reviewed and was found to be reactive. Baseline140 with Moderate variability; No decels noted.  Continue recommended antenatal testing and prenatal care.

## 2017-07-02 ENCOUNTER — Encounter: Payer: Medicaid Other | Admitting: Obstetrics and Gynecology

## 2017-07-02 ENCOUNTER — Ambulatory Visit (INDEPENDENT_AMBULATORY_CARE_PROVIDER_SITE_OTHER): Payer: Medicaid Other | Admitting: Certified Nurse Midwife

## 2017-07-02 ENCOUNTER — Other Ambulatory Visit: Payer: Medicaid Other

## 2017-07-02 VITALS — BP 119/66 | HR 70 | Wt 222.5 lb

## 2017-07-02 DIAGNOSIS — Z720 Tobacco use: Secondary | ICD-10-CM

## 2017-07-02 DIAGNOSIS — F1120 Opioid dependence, uncomplicated: Secondary | ICD-10-CM

## 2017-07-02 DIAGNOSIS — O0993 Supervision of high risk pregnancy, unspecified, third trimester: Secondary | ICD-10-CM

## 2017-07-02 DIAGNOSIS — E668 Other obesity: Secondary | ICD-10-CM | POA: Diagnosis not present

## 2017-07-02 LAB — POCT URINALYSIS DIPSTICK
BILIRUBIN UA: NEGATIVE
Blood, UA: NEGATIVE
Glucose, UA: NEGATIVE
Ketones, UA: NEGATIVE
Nitrite, UA: NEGATIVE
PH UA: 8.5 — AB (ref 5.0–8.0)
Protein, UA: NEGATIVE
Spec Grav, UA: 1.01 (ref 1.010–1.025)
Urobilinogen, UA: 0.2 E.U./dL

## 2017-07-02 NOTE — Progress Notes (Signed)
ROB-NST performed today was reviewed and was found to be reactive.  Baseline 130 with moderate variability, with acceleration presents, no decelerations noted.    Continue recommended antenatal testing and prenatal care. Anticipatory guidance regarding 36 week cultures and course of prenatal care. Reviewed red flag symptoms and when to call. RTC x 1 week for Growth US, NST, 36 week cultures, and ROB or sooner if needed.

## 2017-07-02 NOTE — Patient Instructions (Signed)

## 2017-07-02 NOTE — Progress Notes (Signed)
NONSTRESS TEST INTERPRETATION  INDICATIONS: Methadone use and Obesity  FHR baseline: 130 RESULTS:Reactive   PLAN: 1. Continue fetal kick counts twice a day. 2. Continue antepartum testing as scheduled  Debbe B[place] Cambren Helm, CMA

## 2017-07-08 ENCOUNTER — Ambulatory Visit: Payer: Medicaid Other

## 2017-07-08 ENCOUNTER — Ambulatory Visit (INDEPENDENT_AMBULATORY_CARE_PROVIDER_SITE_OTHER): Payer: Medicaid Other | Admitting: Obstetrics and Gynecology

## 2017-07-08 VITALS — BP 118/58 | HR 80 | Wt 223.0 lb

## 2017-07-08 DIAGNOSIS — Z3493 Encounter for supervision of normal pregnancy, unspecified, third trimester: Secondary | ICD-10-CM | POA: Diagnosis not present

## 2017-07-08 DIAGNOSIS — O99323 Drug use complicating pregnancy, third trimester: Secondary | ICD-10-CM

## 2017-07-08 DIAGNOSIS — Z3685 Encounter for antenatal screening for Streptococcus B: Secondary | ICD-10-CM

## 2017-07-08 DIAGNOSIS — Z113 Encounter for screening for infections with a predominantly sexual mode of transmission: Secondary | ICD-10-CM

## 2017-07-08 DIAGNOSIS — F1120 Opioid dependence, uncomplicated: Secondary | ICD-10-CM

## 2017-07-08 LAB — POCT URINALYSIS DIPSTICK
BILIRUBIN UA: NEGATIVE
Glucose, UA: NEGATIVE
KETONES UA: NEGATIVE
LEUKOCYTES UA: NEGATIVE
Nitrite, UA: NEGATIVE
PROTEIN UA: NEGATIVE
SPEC GRAV UA: 1.01 (ref 1.010–1.025)
Urobilinogen, UA: 0.2 E.U./dL
pH, UA: 7.5 (ref 5.0–8.0)

## 2017-07-08 NOTE — Progress Notes (Signed)
ROB & NST done today, pt is doing well, cultures obtained

## 2017-07-08 NOTE — Progress Notes (Signed)
ROB and growth scan with NST: CBC repeated, still plans PPTL, FKC re-iterated.labor precautions discussed.  NST performed today was reviewed and was found to be reactive.  Baseline 142 with moderate variability, no decels noted. AFI normal at 13.0 cm.   Continue recommended antenatal testing and prenatal care.   Indications:Growth and AFI Findings:  Singleton intrauterine pregnancy is visualized with FHR at 133 BPM. Biometrics give an (U/S) Gestational age of 30 5/7 weeks and an (U/S) EDD of 08/07/17; this correlates with the clinically established EDD of 08/05/17.  Fetal presentation is Vertex.  EFW: 2798g (6lb 3oz) Williams 45%ile. Placenta: Posterior, Grade 0, remote to cervix. AFI: 15.2 cm.  Fetal stomach and bladder are visualized.  Impression: 1. 35 5/7 week Viable Singleton Intrauterine pregnancy by U/S. 2. (U/S) EDD is consistent with Clinically established (LMP) EDD of 08/05/17. 3. Adequate growth and AFI

## 2017-07-09 LAB — CBC WITH DIFFERENTIAL/PLATELET
BASOS ABS: 0 10*3/uL (ref 0.0–0.2)
Basos: 0 %
EOS (ABSOLUTE): 0 10*3/uL (ref 0.0–0.4)
EOS: 0 %
HEMATOCRIT: 31.8 % — AB (ref 34.0–46.6)
Hemoglobin: 10.5 g/dL — ABNORMAL LOW (ref 11.1–15.9)
IMMATURE GRANULOCYTES: 1 %
Immature Grans (Abs): 0 10*3/uL (ref 0.0–0.1)
LYMPHS ABS: 0.8 10*3/uL (ref 0.7–3.1)
Lymphs: 12 %
MCH: 28.2 pg (ref 26.6–33.0)
MCHC: 33 g/dL (ref 31.5–35.7)
MCV: 86 fL (ref 79–97)
MONOS ABS: 0.6 10*3/uL (ref 0.1–0.9)
Monocytes: 9 %
NEUTROS PCT: 78 %
Neutrophils Absolute: 5.2 10*3/uL (ref 1.4–7.0)
PLATELETS: 196 10*3/uL (ref 150–379)
RBC: 3.72 x10E6/uL — AB (ref 3.77–5.28)
RDW: 14.9 % (ref 12.3–15.4)
WBC: 6.6 10*3/uL (ref 3.4–10.8)

## 2017-07-09 LAB — MONITOR DRUG PROFILE 14(MW)
Amphetamine Scrn, Ur: NEGATIVE ng/mL
BARBITURATE SCREEN URINE: NEGATIVE ng/mL
BENZODIAZEPINE SCREEN, URINE: NEGATIVE ng/mL
BUPRENORPHINE, URINE: NEGATIVE ng/mL
CANNABINOIDS UR QL SCN: NEGATIVE ng/mL
COCAINE(METAB.)SCREEN, URINE: NEGATIVE ng/mL
CREATININE(CRT), U: 143 mg/dL (ref 20.0–300.0)
FENTANYL, URINE: NEGATIVE pg/mL
MEPERIDINE SCREEN, URINE: NEGATIVE ng/mL
METHADONE SCREEN, URINE: NEGATIVE ng/mL
OXYCODONE+OXYMORPHONE UR QL SCN: NEGATIVE ng/mL
Opiate Scrn, Ur: NEGATIVE ng/mL
PHENCYCLIDINE QUANTITATIVE URINE: NEGATIVE ng/mL
Ph of Urine: 7.7 (ref 4.5–8.9)
Propoxyphene Scrn, Ur: NEGATIVE ng/mL
SPECIFIC GRAVITY: 1.024
Tramadol Screen, Urine: NEGATIVE ng/mL

## 2017-07-10 LAB — GC/CHLAMYDIA PROBE AMP
Chlamydia trachomatis, NAA: NEGATIVE
Neisseria gonorrhoeae by PCR: NEGATIVE

## 2017-07-10 LAB — STREP GP B NAA: Strep Gp B NAA: NEGATIVE

## 2017-07-15 ENCOUNTER — Encounter: Payer: Self-pay | Admitting: Certified Nurse Midwife

## 2017-07-15 ENCOUNTER — Other Ambulatory Visit: Payer: Medicaid Other

## 2017-07-15 ENCOUNTER — Ambulatory Visit (INDEPENDENT_AMBULATORY_CARE_PROVIDER_SITE_OTHER): Payer: Medicaid Other | Admitting: Certified Nurse Midwife

## 2017-07-15 VITALS — BP 111/60 | HR 82 | Wt 224.6 lb

## 2017-07-15 DIAGNOSIS — R82998 Other abnormal findings in urine: Secondary | ICD-10-CM

## 2017-07-15 DIAGNOSIS — Z3493 Encounter for supervision of normal pregnancy, unspecified, third trimester: Secondary | ICD-10-CM | POA: Diagnosis not present

## 2017-07-15 DIAGNOSIS — R8299 Other abnormal findings in urine: Secondary | ICD-10-CM

## 2017-07-15 LAB — POCT URINALYSIS DIPSTICK
BILIRUBIN UA: NEGATIVE
Blood, UA: NEGATIVE
Glucose, UA: NEGATIVE
KETONES UA: NEGATIVE
Nitrite, UA: NEGATIVE
PH UA: 7 (ref 5.0–8.0)
Protein, UA: NEGATIVE
Spec Grav, UA: 1.015 (ref 1.010–1.025)
Urobilinogen, UA: 0.2 E.U./dL

## 2017-07-15 NOTE — Progress Notes (Signed)
ROB w/NST-NST performed today was reviewed and was found to be reactive. Baseline 125 bpm with Moderate variability, accelerations present, and no decelerations noted. Continue recommended antenatal testing and prenatal care. Encouraged twice daily kick counts. Reviewed red flag symptoms and when to call. RTC x 1 week for NST and ROB or sooner if needed.

## 2017-07-15 NOTE — Patient Instructions (Signed)
Fetal Movement Counts Patient Name: ________________________________________________ Patient Due Date: ____________________ What is a fetal movement count? A fetal movement count is the number of times that you feel your baby move during a certain amount of time. This may also be called a fetal kick count. A fetal movement count is recommended for every pregnant woman. You may be asked to start counting fetal movements as early as week 28 of your pregnancy. Pay attention to when your baby is most active. You may notice your baby's sleep and wake cycles. You may also notice things that make your baby move more. You should do a fetal movement count:  When your baby is normally most active.  At the same time each day.  A good time to count movements is while you are resting, after having something to eat and drink. How do I count fetal movements? 1. Find a quiet, comfortable area. Sit, or lie down on your side. 2. Write down the date, the start time and stop time, and the number of movements that you felt between those two times. Take this information with you to your health care visits. 3. For 2 hours, count kicks, flutters, swishes, rolls, and jabs. You should feel at least 10 movements during 2 hours. 4. You may stop counting after you have felt 10 movements. 5. If you do not feel 10 movements in 2 hours, have something to eat and drink. Then, keep resting and counting for 1 hour. If you feel at least 4 movements during that hour, you may stop counting. Contact a health care provider if:  You feel fewer than 4 movements in 2 hours.  Your baby is not moving like he or she usually does. Date: ____________ Start time: ____________ Stop time: ____________ Movements: ____________ Date: ____________ Start time: ____________ Stop time: ____________ Movements: ____________ Date: ____________ Start time: ____________ Stop time: ____________ Movements: ____________ Date: ____________ Start time:  ____________ Stop time: ____________ Movements: ____________ Date: ____________ Start time: ____________ Stop time: ____________ Movements: ____________ Date: ____________ Start time: ____________ Stop time: ____________ Movements: ____________ Date: ____________ Start time: ____________ Stop time: ____________ Movements: ____________ Date: ____________ Start time: ____________ Stop time: ____________ Movements: ____________ Date: ____________ Start time: ____________ Stop time: ____________ Movements: ____________ This information is not intended to replace advice given to you by your health care provider. Make sure you discuss any questions you have with your health care provider. Document Released: 12/16/2006 Document Revised: 07/15/2016 Document Reviewed: 12/26/2015 Elsevier Interactive Patient Education  2018 [employer] Nonstress Test The nonstress test is a procedure that monitors the fetus's heartbeat. The test will monitor the heartbeat when the fetus is at rest and while the fetus is moving. In a healthy fetus, there will be an increase in fetal heart rate when the fetus moves or kicks. The heart rate will decrease at rest. This test helps determine if the fetus is healthy. Your health care provider will look at a number of patterns in the heart rate tracing to make sure your baby is thriving. If there is concern, your health care provider may order additional tests or may suggest another course of action. This test is often done in the third trimester and can help determine if an early delivery is needed and safe. Common reasons to have this test are:  You are past your due date.  You have a high-risk pregnancy.  You are feeling less movement than normal.  You have lost a pregnancy in the past.  Your health   care provider suspects fetal growth problems.  You have too much or too little amniotic fluid.  What happens before the procedure?  Eat a meal right before the test or as  directed by your health care provider. Food may help stimulate fetal movements.  Use the restroom right before the test. What happens during the procedure?  Two belts will be placed around your abdomen. These belts have monitors attached to them. One records the fetal heart rate and the other records uterine contractions.  You may be asked to lie down on your side or to stay sitting upright.  You may be given a button to press when you feel movement.  The fetal heartbeat is listened to and watched on a screen. The heartbeat is recorded on a sheet of paper.  If the fetus seems to be sleeping, you may be asked to drink some juice or soda, gently press your abdomen, or make some noise to wake the fetus. What happens after the procedure? Your health care provider will discuss the test results with you and make recommendations for the near future.  This information is not intended to replace advice given to you by your health care provider. Make sure you discuss any questions you have with your health care provider. This information is not intended to replace advice given to you by your health care provider. Make sure you discuss any questions you have with your health care provider. Document Released: 11/06/2002 Document Revised: 10/16/2016 Document Reviewed: 12/20/2012 Elsevier Interactive Patient Education  2018 Elsevier Inc.  

## 2017-07-17 LAB — CULTURE, OB URINE

## 2017-07-17 LAB — URINE CULTURE, OB REFLEX

## 2017-07-21 ENCOUNTER — Ambulatory Visit (INDEPENDENT_AMBULATORY_CARE_PROVIDER_SITE_OTHER): Payer: Medicaid Other | Admitting: Certified Nurse Midwife

## 2017-07-21 ENCOUNTER — Other Ambulatory Visit: Payer: Medicaid Other

## 2017-07-21 VITALS — BP 109/60 | HR 85 | Wt 227.0 lb

## 2017-07-21 DIAGNOSIS — R8299 Other abnormal findings in urine: Secondary | ICD-10-CM

## 2017-07-21 DIAGNOSIS — Z3493 Encounter for supervision of normal pregnancy, unspecified, third trimester: Secondary | ICD-10-CM

## 2017-07-21 DIAGNOSIS — E668 Other obesity: Secondary | ICD-10-CM | POA: Diagnosis not present

## 2017-07-21 DIAGNOSIS — R82998 Other abnormal findings in urine: Secondary | ICD-10-CM

## 2017-07-21 DIAGNOSIS — F1120 Opioid dependence, uncomplicated: Secondary | ICD-10-CM | POA: Diagnosis not present

## 2017-07-21 LAB — POCT URINALYSIS DIPSTICK
BILIRUBIN UA: NEGATIVE
Glucose, UA: NEGATIVE
KETONES UA: NEGATIVE
Nitrite, UA: NEGATIVE
Protein, UA: NEGATIVE
Spec Grav, UA: 1.02 (ref 1.010–1.025)
Urobilinogen, UA: 0.2 E.U./dL
pH, UA: 6.5 (ref 5.0–8.0)

## 2017-07-21 NOTE — Addendum Note (Signed)
Addended by: Jackquline Denmark on: 07/21/2017 11:44 AM   Modules accepted: Orders

## 2017-07-21 NOTE — Progress Notes (Signed)
ROB and NST. PT doing well. Has no complaints. NST: reactive after 1 hr. Monitoring. baseline 125, Accelerations present, decels absent, moderate variability and occasional contractions. Category 1 strip. Encouraged fetal kick counts. Return in 1 wk for NST and ROB.  Doreene Burke, CNM

## 2017-07-21 NOTE — Patient Instructions (Signed)

## 2017-07-23 LAB — URINE CULTURE

## 2017-07-28 ENCOUNTER — Ambulatory Visit (INDEPENDENT_AMBULATORY_CARE_PROVIDER_SITE_OTHER): Payer: Medicaid Other | Admitting: Certified Nurse Midwife

## 2017-07-28 ENCOUNTER — Other Ambulatory Visit: Payer: Medicaid Other

## 2017-07-28 DIAGNOSIS — E668 Other obesity: Secondary | ICD-10-CM | POA: Diagnosis not present

## 2017-07-28 DIAGNOSIS — Z283 Underimmunization status: Secondary | ICD-10-CM | POA: Diagnosis not present

## 2017-07-28 DIAGNOSIS — Z3A38 38 weeks gestation of pregnancy: Secondary | ICD-10-CM

## 2017-07-28 DIAGNOSIS — O9989 Other specified diseases and conditions complicating pregnancy, childbirth and the puerperium: Secondary | ICD-10-CM | POA: Diagnosis not present

## 2017-07-28 DIAGNOSIS — O99213 Obesity complicating pregnancy, third trimester: Secondary | ICD-10-CM | POA: Diagnosis not present

## 2017-07-28 DIAGNOSIS — F1120 Opioid dependence, uncomplicated: Secondary | ICD-10-CM | POA: Diagnosis not present

## 2017-07-28 NOTE — Patient Instructions (Signed)

## 2017-07-28 NOTE — Progress Notes (Signed)
Rob, doing well. No complaints. She hsa moderate lueks in her urine with urgency . Will send for culture. Discussed NST & BPP at next visit in one week. Reviewed labor precautions and fetal movement.   Nst: reactive, basline 120's, Moderate variability, accelerations present, no deceration no contractions.   Doreene BurkeAnnie Shalanda Brogden, CNM

## 2017-07-30 ENCOUNTER — Encounter: Payer: Self-pay | Admitting: Certified Nurse Midwife

## 2017-07-30 ENCOUNTER — Other Ambulatory Visit: Payer: Self-pay | Admitting: Certified Nurse Midwife

## 2017-07-30 DIAGNOSIS — O480 Post-term pregnancy: Secondary | ICD-10-CM

## 2017-07-30 LAB — URINE CULTURE

## 2017-08-04 ENCOUNTER — Ambulatory Visit (INDEPENDENT_AMBULATORY_CARE_PROVIDER_SITE_OTHER): Payer: Medicaid Other | Admitting: Certified Nurse Midwife

## 2017-08-04 ENCOUNTER — Telehealth: Payer: Self-pay

## 2017-08-04 VITALS — BP 112/76 | HR 97 | Wt 224.0 lb

## 2017-08-04 DIAGNOSIS — Z3493 Encounter for supervision of normal pregnancy, unspecified, third trimester: Secondary | ICD-10-CM

## 2017-08-04 LAB — POCT URINALYSIS DIPSTICK
BILIRUBIN UA: NEGATIVE
GLUCOSE UA: NEGATIVE
Ketones, UA: NEGATIVE
Nitrite, UA: NEGATIVE
Protein, UA: NEGATIVE
SPEC GRAV UA: 1.015 (ref 1.010–1.025)
UROBILINOGEN UA: 0.2 U/dL
pH, UA: 6 (ref 5.0–8.0)

## 2017-08-04 NOTE — Progress Notes (Signed)
Pt presents today for leaking. She experienced some leaking this am at around 7 am when she got up and then again around 9 am. Spec. Exam no fluid seen , clear to white mucus. She denies contractions and endorses good fetal movement. Nitrazine negative, fern negative. SVE 3 cm. Discussed labor precautions. Return as scheduled on Friday for ROB.   Doreene BurkeAnnie Wilmot Quevedo, CNM

## 2017-08-04 NOTE — Telephone Encounter (Signed)
Changed pants x2, leakage of fluid. Does not feel any contractions. No vaginal bleeding. Pt to come to office to be checked. Will be here in about an hour.

## 2017-08-04 NOTE — Progress Notes (Signed)
Pt had called earlier, here to be seen for possibly leaking fluid. This started this am and she notes this when she gets up and moves around. U/A results positive for mod. Leukocytes, and non-hemolyzed blood. No contractions or vaginal bleeding.

## 2017-08-04 NOTE — Patient Instructions (Signed)

## 2017-08-06 ENCOUNTER — Ambulatory Visit: Payer: Medicaid Other

## 2017-08-06 ENCOUNTER — Ambulatory Visit (INDEPENDENT_AMBULATORY_CARE_PROVIDER_SITE_OTHER): Payer: Medicaid Other | Admitting: Certified Nurse Midwife

## 2017-08-06 VITALS — BP 122/69 | HR 83 | Wt 228.2 lb

## 2017-08-06 DIAGNOSIS — O0993 Supervision of high risk pregnancy, unspecified, third trimester: Secondary | ICD-10-CM | POA: Diagnosis not present

## 2017-08-06 DIAGNOSIS — O480 Post-term pregnancy: Secondary | ICD-10-CM

## 2017-08-06 LAB — POCT URINALYSIS DIPSTICK
Bilirubin, UA: NEGATIVE
GLUCOSE UA: NEGATIVE
LEUKOCYTES UA: NEGATIVE
Nitrite, UA: NEGATIVE
PROTEIN UA: NEGATIVE
Spec Grav, UA: 1.015 (ref 1.010–1.025)
Urobilinogen, UA: 0.2 E.U./dL
pH, UA: 7 (ref 5.0–8.0)

## 2017-08-06 NOTE — Patient Instructions (Signed)
Augmentation of Labor Augmentation of labor is when steps are taken to stimulate and strengthen uterine contractions during labor. This may be done when the contractions have slowed down or stopped, delaying progress of labor and delivery of the baby. Before beginning augmentation of labor, the health care provider will evaluate the condition of the mother and baby, the size and position of the baby, and the size of the birth canal. What are the reasons for labor augmentation? Reasons for augmentation of labor include:  Slow labor (prolonged first and second stage of labor) that has been associated with increased maternal risks, such as chorioamnionitis, postpartum hemorrhage, operative vaginal delivery, or third-degree or fourth-degree perineal lacerations.  Decreased average length of labor.  What methods are used for labor augmentation? Various methods may be used for augmentation of labor, including:  Oxytocin medicine. This medicine stimulates contractions. It is given through an IV access tube inserted into a vein.  Breaking the fluid-filled sac that surrounds the fetus (amniotic sac).  Stripping the membranes. The health care provider separates amniotic sac tissue from the cervix, causing the release of a hormone called progesterone that can stimulate uterine contractions.  Nipple stimulation.  Stimulation of certain pressure points on the ankles.  Manual or mechanical dilation of the cervix.  What are the risks associated with labor augmentation?  Overstimulation of the uterine contractions (continuous, prolonged, very strong contractions), causing fetal distress.  Increased chance of infection for the mother and baby.  Uterine tearing (rupture).  Breaking off (abruption) of the placenta.  Increased chance of cesarean, forceps, or vacuum delivery. What are some reasons for not doing labor augmentation? Augmentation of labor should not be done if:  The baby is too big for  the birth canal. This can be confirmed by ultrasonography.  The umbilical cord drops in front of the baby's head or breech part (prolapsed cord).  The mother had a previous cesarean delivery with a vertical incision in the uterus (or the kind of incision used is not known). High dose oxytocin should not be used if the mother had a previous cesarean delivery of any kind.  The mother had previous surgery on or into the uterus.  The mother has herpes.  The mother has cervical cancer.  The baby is lying sideways.  The mother's pelvis is deformed.  The mother is pregnant with more than two babies.  This information is not intended to replace advice given to you by your health care provider. Make sure you discuss any questions you have with your health care provider. Document Released: 05/11/2007 Document Revised: 04/29/2016 Document Reviewed: 06/15/2013 Elsevier Interactive Patient Education  2017 Elsevier Inc.  

## 2017-08-06 NOTE — Progress Notes (Signed)
ROB-Pt here today for BPP and NST. BPP: 8/8. NST performed today was reviewed and was found to be reactive. Baseline 120 with Moderate variability, accelerations present and no decelerations noted.  Continue twice daily kick counts. Reviewed red flag symptoms and when to call. RTC on Tuesday for NST and ROB. Induction of labor scheduled for Friday, 08/13/2017, at 0500; pt notified and verbalized understanding.    ULTRASOUND REPORT  Location: [facility] Date of Service: 08/06/17  Indications:Post dates BPP Findings:  Singleton intrauterine pregnancy is visualized with FHR at 121 BPM. Fetal presentation is Vertex.  Placenta: Anterior fundal, grade 3, remote to cervix. AFI: 11 cm.  Fetal tone, fetal movement and fetal breathing were all observed within a 30 minute time frame.  BPP Score is 8 of 8.  Impression: 1. BPP Score 8 of 8.  Recommendations: 1.Clinical correlation with the patient's History and Physical Exam.

## 2017-08-10 ENCOUNTER — Other Ambulatory Visit: Payer: Self-pay | Admitting: Obstetrics and Gynecology

## 2017-08-10 ENCOUNTER — Ambulatory Visit (INDEPENDENT_AMBULATORY_CARE_PROVIDER_SITE_OTHER): Payer: Medicaid Other | Admitting: Certified Nurse Midwife

## 2017-08-10 ENCOUNTER — Other Ambulatory Visit: Payer: Medicaid Other

## 2017-08-10 VITALS — BP 127/80 | HR 83 | Wt 227.1 lb

## 2017-08-10 DIAGNOSIS — O0993 Supervision of high risk pregnancy, unspecified, third trimester: Secondary | ICD-10-CM | POA: Diagnosis not present

## 2017-08-10 LAB — POCT URINALYSIS DIPSTICK
Bilirubin, UA: NEGATIVE
Glucose, UA: NEGATIVE
KETONES UA: 1.01
Nitrite, UA: NEGATIVE
PROTEIN UA: NEGATIVE
SPEC GRAV UA: 1.02 (ref 1.010–1.025)
UROBILINOGEN UA: 0.2 U/dL
pH, UA: 6 (ref 5.0–8.0)

## 2017-08-10 NOTE — Progress Notes (Signed)
Pt present for NST due to postdates  NST: reactive Baseline: 125 Variability: Moderate Decelerations: absent  Toco: occasional contraction.   She will follow up as scheduled.   Doreene BurkeAnnie Aarini Slee, CNM

## 2017-08-10 NOTE — Patient Instructions (Signed)

## 2017-08-13 ENCOUNTER — Inpatient Hospital Stay: Payer: Medicaid Other | Admitting: Anesthesiology

## 2017-08-13 ENCOUNTER — Inpatient Hospital Stay: Payer: Medicaid Other | Admitting: Certified Registered"

## 2017-08-13 ENCOUNTER — Encounter
Admission: EM | Disposition: A | Discharge status: Home / Self Care | Payer: Self-pay | Source: Home / Self Care | Attending: Certified Nurse Midwife

## 2017-08-13 ENCOUNTER — Encounter: Payer: Self-pay | Admitting: *Deleted

## 2017-08-13 ENCOUNTER — Inpatient Hospital Stay
Admission: EM | Admit: 2017-08-13 | Discharge: 2017-08-15 | DRG: 767 | Disposition: A | Discharge status: Home / Self Care | Payer: Medicaid Other | Attending: Certified Nurse Midwife | Admitting: Certified Nurse Midwife

## 2017-08-13 DIAGNOSIS — O9902 Anemia complicating childbirth: Secondary | ICD-10-CM | POA: Diagnosis present

## 2017-08-13 DIAGNOSIS — D649 Anemia, unspecified: Secondary | ICD-10-CM | POA: Diagnosis present

## 2017-08-13 DIAGNOSIS — O99324 Drug use complicating childbirth: Secondary | ICD-10-CM | POA: Diagnosis present

## 2017-08-13 DIAGNOSIS — O480 Post-term pregnancy: Secondary | ICD-10-CM | POA: Diagnosis present

## 2017-08-13 DIAGNOSIS — Z3A41 41 weeks gestation of pregnancy: Secondary | ICD-10-CM | POA: Diagnosis not present

## 2017-08-13 DIAGNOSIS — Z87891 Personal history of nicotine dependence: Secondary | ICD-10-CM

## 2017-08-13 DIAGNOSIS — F1190 Opioid use, unspecified, uncomplicated: Secondary | ICD-10-CM | POA: Diagnosis present

## 2017-08-13 DIAGNOSIS — Z302 Encounter for sterilization: Secondary | ICD-10-CM | POA: Diagnosis not present

## 2017-08-13 DIAGNOSIS — Z3490 Encounter for supervision of normal pregnancy, unspecified, unspecified trimester: Secondary | ICD-10-CM | POA: Diagnosis present

## 2017-08-13 HISTORY — PX: TUBAL LIGATION: SHX77

## 2017-08-13 LAB — TYPE AND SCREEN
ABO/RH(D): O POS
ANTIBODY SCREEN: NEGATIVE

## 2017-08-13 LAB — CBC
HEMATOCRIT: 31.3 % — AB (ref 35.0–47.0)
HEMOGLOBIN: 10.7 g/dL — AB (ref 12.0–16.0)
MCH: 28.6 pg (ref 26.0–34.0)
MCHC: 34.2 g/dL (ref 32.0–36.0)
MCV: 83.7 fL (ref 80.0–100.0)
Platelets: 221 10*3/uL (ref 150–440)
RBC: 3.74 MIL/uL — ABNORMAL LOW (ref 3.80–5.20)
RDW: 15.2 % — ABNORMAL HIGH (ref 11.5–14.5)
WBC: 8.7 10*3/uL (ref 3.6–11.0)

## 2017-08-13 LAB — URINE DRUG SCREEN, QUALITATIVE (ARMC ONLY)
AMPHETAMINES, UR SCREEN: NOT DETECTED
BENZODIAZEPINE, UR SCRN: NOT DETECTED
Barbiturates, Ur Screen: NOT DETECTED
Cannabinoid 50 Ng, Ur ~~LOC~~: NOT DETECTED
Cocaine Metabolite,Ur ~~LOC~~: NOT DETECTED
MDMA (Ecstasy)Ur Screen: NOT DETECTED
METHADONE SCREEN, URINE: POSITIVE — AB
Opiate, Ur Screen: NOT DETECTED
Phencyclidine (PCP) Ur S: NOT DETECTED
Tricyclic, Ur Screen: NOT DETECTED

## 2017-08-13 SURGERY — LIGATION, FALLOPIAN TUBE, POSTPARTUM
Anesthesia: Epidural | Laterality: Bilateral

## 2017-08-13 MED ORDER — OXYTOCIN 40 UNITS IN LACTATED RINGERS INFUSION - SIMPLE MED
1.0000 m[IU]/min | INTRAVENOUS | Status: DC
  Administered 2017-08-13: 2 m[IU]/min via INTRAVENOUS
  Filled 2017-08-13: qty 1000

## 2017-08-13 MED ORDER — TETANUS-DIPHTH-ACELL PERTUSSIS 5-2.5-18.5 LF-MCG/0.5 IM SUSP: 0.5000 mL | Freq: Once | INTRAMUSCULAR | Status: DC

## 2017-08-13 MED ORDER — TERBUTALINE SULFATE 1 MG/ML IJ SOLN: 0.2500 mg | Freq: Once | INTRAMUSCULAR | Status: DC | PRN

## 2017-08-13 MED ORDER — BUPIVACAINE HCL (PF) 0.5 % IJ SOLN
INTRAMUSCULAR | Status: DC | PRN
  Administered 2017-08-13: 5 mL

## 2017-08-13 MED ORDER — FENTANYL CITRATE (PF) 100 MCG/2ML IJ SOLN
INTRAMUSCULAR | Status: DC | PRN
  Administered 2017-08-13 (×2): 50 ug via INTRAVENOUS

## 2017-08-13 MED ORDER — METHADONE HCL 10 MG/ML PO CONC
35.0000 mg | Freq: Every day | ORAL | Status: DC
  Administered 2017-08-14 – 2017-08-15 (×2): 35 mg via ORAL
  Filled 2017-08-13 (×2): qty 3.5

## 2017-08-13 MED ORDER — LACTATED RINGERS IV SOLN
INTRAVENOUS | Status: DC
  Administered 2017-08-13 (×2): via INTRAVENOUS

## 2017-08-13 MED ORDER — PHENYLEPHRINE 40 MCG/ML (10ML) SYRINGE FOR IV PUSH (FOR BLOOD PRESSURE SUPPORT): 80.0000 ug | PREFILLED_SYRINGE | INTRAVENOUS | Status: DC | PRN

## 2017-08-13 MED ORDER — ONDANSETRON HCL 4 MG/2ML IJ SOLN
INTRAMUSCULAR | Status: AC
  Filled 2017-08-13: qty 2

## 2017-08-13 MED ORDER — FENTANYL CITRATE (PF) 100 MCG/2ML IJ SOLN
INTRAMUSCULAR | Status: AC
  Administered 2017-08-13: 25 ug via INTRAVENOUS
  Filled 2017-08-13: qty 2

## 2017-08-13 MED ORDER — SIMETHICONE 80 MG PO CHEW: 80.0000 mg | CHEWABLE_TABLET | ORAL | Status: DC | PRN

## 2017-08-13 MED ORDER — METOCLOPRAMIDE HCL 10 MG PO TABS: 10.0000 mg | ORAL_TABLET | Freq: Once | ORAL | Status: DC

## 2017-08-13 MED ORDER — KETOROLAC TROMETHAMINE 30 MG/ML IJ SOLN: 30.0000 mg | Freq: Four times a day (QID) | INTRAMUSCULAR | Status: DC

## 2017-08-13 MED ORDER — ONDANSETRON HCL 4 MG PO TABS: 4.0000 mg | ORAL_TABLET | ORAL | Status: DC | PRN

## 2017-08-13 MED ORDER — SENNOSIDES-DOCUSATE SODIUM 8.6-50 MG PO TABS
2.0000 | ORAL_TABLET | ORAL | Status: DC
  Administered 2017-08-14 (×2): 2 via ORAL
  Filled 2017-08-13 (×2): qty 2

## 2017-08-13 MED ORDER — DIPHENHYDRAMINE HCL 50 MG/ML IJ SOLN: 12.5000 mg | INTRAMUSCULAR | Status: DC | PRN

## 2017-08-13 MED ORDER — ONDANSETRON HCL 4 MG/2ML IJ SOLN: 4.0000 mg | Freq: Four times a day (QID) | INTRAMUSCULAR | Status: DC | PRN

## 2017-08-13 MED ORDER — LACTATED RINGERS IV SOLN
INTRAVENOUS | Status: DC | PRN
  Administered 2017-08-13: 16:00:00 via INTRAVENOUS

## 2017-08-13 MED ORDER — MEASLES, MUMPS & RUBELLA VAC ~~LOC~~ INJ
0.5000 mL | INJECTION | Freq: Once | SUBCUTANEOUS | Status: AC
  Administered 2017-08-15: 0.5 mL via SUBCUTANEOUS
  Filled 2017-08-13 (×2): qty 0.5

## 2017-08-13 MED ORDER — DIBUCAINE 1 % RE OINT: 1.0000 "application " | TOPICAL_OINTMENT | RECTAL | Status: DC | PRN

## 2017-08-13 MED ORDER — WITCH HAZEL-GLYCERIN EX PADS: 1.0000 "application " | MEDICATED_PAD | CUTANEOUS | Status: DC | PRN

## 2017-08-13 MED ORDER — FENTANYL 2.5 MCG/ML W/ROPIVACAINE 0.15% IN NS 100 ML EPIDURAL (ARMC)
12.0000 mL/h | EPIDURAL | Status: DC
  Administered 2017-08-13: 12 mL/h via EPIDURAL

## 2017-08-13 MED ORDER — BENZOCAINE-MENTHOL 20-0.5 % EX AERO: 1.0000 "application " | INHALATION_SPRAY | CUTANEOUS | Status: DC | PRN

## 2017-08-13 MED ORDER — FLEET ENEMA 7-19 GM/118ML RE ENEM: 1.0000 | ENEMA | RECTAL | Status: DC | PRN

## 2017-08-13 MED ORDER — FENTANYL 2.5 MCG/ML W/ROPIVACAINE 0.15% IN NS 100 ML EPIDURAL (ARMC)
EPIDURAL | Status: AC
  Filled 2017-08-13: qty 100

## 2017-08-13 MED ORDER — LIDOCAINE HCL (PF) 2 % IJ SOLN
INTRAMUSCULAR | Status: AC
  Filled 2017-08-13: qty 10

## 2017-08-13 MED ORDER — EPHEDRINE 5 MG/ML INJ: 10.0000 mg | INTRAVENOUS | Status: DC | PRN

## 2017-08-13 MED ORDER — BUPIVACAINE HCL (PF) 0.25 % IJ SOLN
INTRAMUSCULAR | Status: DC | PRN
  Administered 2017-08-13: 3 mL via EPIDURAL
  Administered 2017-08-13: 5 mL via EPIDURAL

## 2017-08-13 MED ORDER — OXYTOCIN BOLUS FROM INFUSION: 500.0000 mL | Freq: Once | INTRAVENOUS | Status: DC

## 2017-08-13 MED ORDER — PROPOFOL 10 MG/ML IV BOLUS
INTRAVENOUS | Status: AC
Start: 2017-08-13 — End: ?
  Filled 2017-08-13: qty 20

## 2017-08-13 MED ORDER — ACETAMINOPHEN 325 MG PO TABS: 650.0000 mg | ORAL_TABLET | ORAL | Status: DC | PRN

## 2017-08-13 MED ORDER — MIDAZOLAM HCL 2 MG/2ML IJ SOLN
INTRAMUSCULAR | Status: AC
  Filled 2017-08-13: qty 2

## 2017-08-13 MED ORDER — AMMONIA AROMATIC IN INHA
RESPIRATORY_TRACT | Status: AC
  Filled 2017-08-13: qty 10

## 2017-08-13 MED ORDER — ONDANSETRON HCL 4 MG/2ML IJ SOLN: 4.0000 mg | INTRAMUSCULAR | Status: DC | PRN

## 2017-08-13 MED ORDER — IBUPROFEN 600 MG PO TABS: 600.0000 mg | ORAL_TABLET | Freq: Four times a day (QID) | ORAL | Status: DC | PRN

## 2017-08-13 MED ORDER — SOD CITRATE-CITRIC ACID 500-334 MG/5ML PO SOLN: 30.0000 mL | ORAL | Status: DC | PRN

## 2017-08-13 MED ORDER — LACTATED RINGERS IV SOLN
500.0000 mL | Freq: Once | INTRAVENOUS | Status: AC
  Administered 2017-08-13: 500 mL via INTRAVENOUS

## 2017-08-13 MED ORDER — FENTANYL CITRATE (PF) 100 MCG/2ML IJ SOLN
INTRAMUSCULAR | Status: AC
  Filled 2017-08-13: qty 2

## 2017-08-13 MED ORDER — KETOROLAC TROMETHAMINE 30 MG/ML IJ SOLN
30.0000 mg | Freq: Four times a day (QID) | INTRAMUSCULAR | Status: DC
  Administered 2017-08-13: 30 mg via INTRAVENOUS
  Filled 2017-08-13: qty 1

## 2017-08-13 MED ORDER — COCONUT OIL OIL: 1.0000 "application " | TOPICAL_OIL | Status: DC | PRN

## 2017-08-13 MED ORDER — LIDOCAINE HCL (PF) 1 % IJ SOLN: 30.0000 mL | INTRAMUSCULAR | Status: DC | PRN

## 2017-08-13 MED ORDER — LACTATED RINGERS IV SOLN: 500.0000 mL | INTRAVENOUS | Status: DC | PRN

## 2017-08-13 MED ORDER — ZOLPIDEM TARTRATE 5 MG PO TABS: 5.0000 mg | ORAL_TABLET | Freq: Every evening | ORAL | Status: DC | PRN

## 2017-08-13 MED ORDER — DEXTROSE IN LACTATED RINGERS 5 % IV SOLN: INTRAVENOUS | Status: DC

## 2017-08-13 MED ORDER — DIPHENHYDRAMINE HCL 25 MG PO CAPS: 25.0000 mg | ORAL_CAPSULE | Freq: Four times a day (QID) | ORAL | Status: DC | PRN

## 2017-08-13 MED ORDER — HYDROCODONE-ACETAMINOPHEN 5-325 MG PO TABS
1.0000 | ORAL_TABLET | ORAL | Status: AC | PRN
  Administered 2017-08-13: 2 via ORAL
  Filled 2017-08-13: qty 2

## 2017-08-13 MED ORDER — FAMOTIDINE 20 MG PO TABS: 40.0000 mg | ORAL_TABLET | Freq: Once | ORAL | Status: DC

## 2017-08-13 MED ORDER — ONDANSETRON HCL 4 MG PO TABS: 4.0000 mg | ORAL_TABLET | Freq: Four times a day (QID) | ORAL | Status: DC | PRN

## 2017-08-13 MED ORDER — OXYTOCIN 10 UNIT/ML IJ SOLN
INTRAMUSCULAR | Status: AC
  Filled 2017-08-13: qty 2

## 2017-08-13 MED ORDER — LACTATED RINGERS IV SOLN: INTRAVENOUS | Status: DC

## 2017-08-13 MED ORDER — LIDOCAINE HCL (PF) 1 % IJ SOLN
INTRAMUSCULAR | Status: DC | PRN
  Administered 2017-08-13: 3 mL via SUBCUTANEOUS

## 2017-08-13 MED ORDER — PRENATAL MULTIVITAMIN CH
1.0000 | ORAL_TABLET | Freq: Every day | ORAL | Status: DC
  Administered 2017-08-14 – 2017-08-15 (×2): 1 via ORAL
  Filled 2017-08-13 (×2): qty 1

## 2017-08-13 MED ORDER — OXYTOCIN 40 UNITS IN LACTATED RINGERS INFUSION - SIMPLE MED
2.5000 [IU]/h | INTRAVENOUS | Status: DC
  Administered 2017-08-13: 2.5 [IU]/h via INTRAVENOUS
  Administered 2017-08-13: 39.96 [IU]/h via INTRAVENOUS

## 2017-08-13 MED ORDER — ACETAMINOPHEN 325 MG PO TABS
650.0000 mg | ORAL_TABLET | ORAL | Status: DC | PRN
  Administered 2017-08-15: 650 mg via ORAL
  Filled 2017-08-13: qty 2

## 2017-08-13 MED ORDER — LIDOCAINE-EPINEPHRINE (PF) 1.5 %-1:200000 IJ SOLN
INTRAMUSCULAR | Status: DC | PRN
  Administered 2017-08-13: 3 mL via EPIDURAL

## 2017-08-13 MED ORDER — KETOROLAC TROMETHAMINE 30 MG/ML IJ SOLN
30.0000 mg | Freq: Four times a day (QID) | INTRAMUSCULAR | Status: DC
  Administered 2017-08-14 (×2): 30 mg via INTRAVENOUS
  Filled 2017-08-13 (×2): qty 1

## 2017-08-13 MED ORDER — IBUPROFEN 600 MG PO TABS
600.0000 mg | ORAL_TABLET | Freq: Four times a day (QID) | ORAL | Status: DC
  Administered 2017-08-14 – 2017-08-15 (×7): 600 mg via ORAL
  Filled 2017-08-13 (×7): qty 1

## 2017-08-13 MED ORDER — FENTANYL CITRATE (PF) 100 MCG/2ML IJ SOLN
25.0000 ug | INTRAMUSCULAR | Status: DC | PRN
  Administered 2017-08-13 (×4): 25 ug via INTRAVENOUS

## 2017-08-13 MED ORDER — ONDANSETRON HCL 4 MG/2ML IJ SOLN: 4.0000 mg | Freq: Once | INTRAMUSCULAR | Status: DC | PRN

## 2017-08-13 MED ORDER — MISOPROSTOL 200 MCG PO TABS
ORAL_TABLET | ORAL | Status: AC
  Filled 2017-08-13: qty 4

## 2017-08-13 MED ORDER — LIDOCAINE HCL (PF) 2 % IJ SOLN
INTRAMUSCULAR | Status: DC | PRN
  Administered 2017-08-13: 10 mL via EPIDURAL
  Administered 2017-08-13 (×3): 5 mL via EPIDURAL
  Administered 2017-08-13: 10 mL via EPIDURAL
  Administered 2017-08-13 (×2): 5 mL via EPIDURAL

## 2017-08-13 MED ORDER — METHADONE HCL 10 MG/ML PO CONC
35.0000 mg | Freq: Every day | ORAL | Status: DC
  Administered 2017-08-13: 35 mg via ORAL
  Filled 2017-08-13: qty 3.5

## 2017-08-13 MED ORDER — MIDAZOLAM HCL 2 MG/2ML IJ SOLN
INTRAMUSCULAR | Status: DC | PRN
  Administered 2017-08-13 (×2): 2 mg via INTRAVENOUS

## 2017-08-13 MED ORDER — LIDOCAINE HCL (PF) 1 % IJ SOLN
INTRAMUSCULAR | Status: AC
  Filled 2017-08-13: qty 30

## 2017-08-13 MED ORDER — ONDANSETRON HCL 4 MG/2ML IJ SOLN
INTRAMUSCULAR | Status: DC | PRN
  Administered 2017-08-13: 4 mg via INTRAVENOUS

## 2017-08-13 MED ORDER — SIMETHICONE 80 MG PO CHEW: 80.0000 mg | CHEWABLE_TABLET | Freq: Four times a day (QID) | ORAL | Status: DC | PRN

## 2017-08-13 SURGICAL SUPPLY — 23 items
BLADE SURG 15 STRL LF DISP TIS (BLADE) ×1 IMPLANT
BLADE SURG 15 STRL SS (BLADE) ×1
BNDG ADH 2 X3.75 FABRIC TAN LF (GAUZE/BANDAGES/DRESSINGS) IMPLANT
CATH ROBINSON RED A/P 16FR (CATHETERS) ×2 IMPLANT
CHLORAPREP W/TINT 26ML (MISCELLANEOUS) ×2 IMPLANT
CLIP FILSHIE TUBAL LIGA STRL (Clip) ×2 IMPLANT
DRAPE LAPAROTOMY 100X77 ABD (DRAPES) ×2 IMPLANT
GAUZE SPONGE NON-WVN 2X2 STRL (MISCELLANEOUS) ×1 IMPLANT
GLOVE ORTHO TXT STRL SZ7.5 (GLOVE) ×2 IMPLANT
GOWN STRL REUS W/ TWL LRG LVL3 (GOWN DISPOSABLE) ×2 IMPLANT
GOWN STRL REUS W/TWL LRG LVL3 (GOWN DISPOSABLE) ×2
KIT RM TURNOVER CYSTO AR (KITS) ×2 IMPLANT
LABEL OR SOLS (LABEL) ×2 IMPLANT
NEEDLE HYPO 25GX1X1/2 BEV (NEEDLE) ×2 IMPLANT
NS IRRIG 500ML POUR BTL (IV SOLUTION) ×2 IMPLANT
PACK BASIN MINOR ARMC (MISCELLANEOUS) ×2 IMPLANT
SPONGE VERSALON 2X2 STRL (MISCELLANEOUS) ×1
SUT VIC AB 0 SH 27 (SUTURE) ×4 IMPLANT
SUT VIC AB 3-0 SH 27 (SUTURE) ×1
SUT VIC AB 3-0 SH 27X BRD (SUTURE) ×1 IMPLANT
SUT VIC AB 4-0 PS2 18 (SUTURE) ×2 IMPLANT
SUT VICRYL 0 AB UR-6 (SUTURE) ×4 IMPLANT
SYRINGE 10CC LL (SYRINGE) ×2 IMPLANT

## 2017-08-13 NOTE — Progress Notes (Signed)
Tricia Gregory is a 31 y.o. G2P1001 at [redacted]w[redacted]d by LMP admitted for induction of labor due to Post dates. Due date 08/05/2017.  Subjective:  Pt doing well, comfortable since epidural placement. No questions or concerns.   Denies difficulty breathing or respiratory distress, chest pain, abdominal pain, vaginal bleeding, leakage of fluid, or leg pain or swelling.   Objective:  Temp:  [97.8 F (36.6 C)-98.3 F (36.8 C)] 97.8 F (36.6 C) (09/14 1100) Pulse Rate:  [75-108] 75 (09/14 1045) Resp:  [16] 16 (09/14 0510) BP: (93-112)/(47-72) 112/64 (09/14 1045) SpO2:  [97 %-100 %] 98 % (09/14 1055) Weight:  [226 lb (102.5 kg)] 226 lb (102.5 kg) (09/14 0512)  FHT:  FHR: 125 bpm, variability: moderate,  accelerations:  Present,  decelerations:  Absent   UC:   regular, every two (2) to six (6) minutes, soft resting tone, pitocin 18 mu/min  SVE:   Dilation: 6 Effacement (%): 80 Station: -1 Exam by:: Serafina Royals CNM   Labs: Lab Results  Component Value Date   WBC 8.7 08/13/2017   HGB 10.7 (L) 08/13/2017   HCT 31.3 (L) 08/13/2017   MCV 83.7 08/13/2017   PLT 221 08/13/2017    Assessment:  Tricia Gregory is a 31 y.o. G2P1001 at [redacted]w[redacted]d admitted for induction of labor due to postdates pregnancy, methadone use, anemia of pregnancy, smoker, Rh positive, GBS negative  FHR Category I  Plan:  AROM without difficulty-clear fluid, moderate amount.   Continue orders as written. Reassess as needed.    Gunnar Bulla, CNM 08/13/2017, 11:12 AM

## 2017-08-13 NOTE — Interval H&P Note (Signed)
History and Physical Interval Note:  08/13/2017 3:49 PM  Marcello Moores  has presented today for surgery, with the diagnosis of post partum desires sterilization  The various methods of treatment have been discussed with the patient and family. After consideration of risks, benefits and other options for treatment, the patient has consented to  Procedure(s): POST PARTUM TUBAL LIGATION (Bilateral) as a surgical intervention .  The patient's history has been reviewed, patient examined, no change in status, stable for surgery.  I have reviewed the patient's chart and labs.  Questions were answered to the patient's satisfaction.     Brennan Bailey

## 2017-08-13 NOTE — H&P (Signed)
Tricia Gregory is an 31 y.o. female. She is immediately PP and has signed papers and orally requested perm. Sterilization.    Patient's last menstrual period was 10/29/2016 (approximate).    Past Medical History:  Diagnosis Date  . Amenorrhea   . Substance abuse    started Methadone in 2015    Past Surgical History:  Procedure Laterality Date  . TONSILLECTOMY      Family History  Problem Relation Age of Onset  . Cancer Maternal Grandmother 50       breast  . Diabetes Maternal Grandfather     Social History:  reports that she has quit smoking. Her smoking use included Cigarettes. She has never used smokeless tobacco. She reports that she does not drink alcohol or use drugs.  Allergies: No Known Allergies  Prescriptions Prior to Admission  Medication Sig Dispense Refill Last Dose  . bisacodyl (DULCOLAX) 5 MG EC tablet Take 1 tablet (5 mg total) by mouth daily as needed for moderate constipation. 30 tablet 0 Past Week at Unknown time  . ferrous sulfate 325 (65 FE) MG EC tablet Take 325 mg by mouth 3 (three) times daily with meals.   08/12/2017 at Unknown time  . methadone (DOLOPHINE) 10 MG/5ML solution Take 35 mg by mouth daily.   08/12/2017 at Unknown time  . Prenatal Vit-Fe Fumarate-FA (MULTIVITAMIN-PRENATAL) 27-0.8 MG TABS tablet Take 1 tablet by mouth daily at 12 noon.   08/12/2017 at Unknown time    ROS  Blood pressure (!) 113/56, pulse 90, temperature 98.3 F (36.8 C), temperature source Oral, resp. rate 16, height  (1.575 m), weight 226 lb (102.5 kg), last menstrual period 10/29/2016, SpO2 99 %. Physical Exam  Results for orders placed or performed during the hospital encounter of 08/13/17 (from the past 24 hour(s))  Urine Drug Screen, Qualitative ([facility] only)     Status: Abnormal   Collection Time: 08/13/17  6:17 AM  Result Value Ref Range   Tricyclic, Ur Screen NONE DETECTED NONE DETECTED   Amphetamines, Ur Screen NONE DETECTED NONE DETECTED   MDMA  (Ecstasy)Ur Screen NONE DETECTED NONE DETECTED   Cocaine Metabolite,Ur [place] NONE DETECTED NONE DETECTED   Opiate, Ur Screen NONE DETECTED NONE DETECTED   Phencyclidine (PCP) Ur S NONE DETECTED NONE DETECTED   Cannabinoid 50 Ng, Ur [place] NONE DETECTED NONE DETECTED   Barbiturates, Ur Screen NONE DETECTED NONE DETECTED   Benzodiazepine, Ur Scrn NONE DETECTED NONE DETECTED   Methadone Scn, Ur POSITIVE (A) NONE DETECTED  CBC     Status: Abnormal   Collection Time: 08/13/17  6:45 AM  Result Value Ref Range   WBC 8.7 3.6 - 11.0 K/uL   RBC 3.74 (L) 3.80 - 5.20 MIL/uL   Hemoglobin 10.7 (L) 12.0 - 16.0 g/dL   HCT 16.1 (L) 09.6 - 04.5 %   MCV 83.7 80.0 - 100.0 fL   MCH 28.6 26.0 - 34.0 pg   MCHC 34.2 32.0 - 36.0 g/dL   RDW 40.9 (H) 81.1 - 91.4 %   Platelets 221 150 - 440 K/uL  Type and screen [facility]     Status: None   Collection Time: 08/13/17  6:45 AM  Result Value Ref Range   ABO/RH(D) O POS    Antibody Screen NEG    Sample Expiration 08/16/2017    Physical examination General NAD, Conversant  HEENT Atraumatic; Op clear with mmm.  Normo-cephalic. Pupils reactive. Anicteric sclerae  Thyroid/Neck Smooth without nodularity or enlargement.  Normal ROM.  Neck Supple.  Skin No rashes, lesions or ulceration. Normal palpated skin turgor. No nodularity.  Breasts: Deferred  Lungs: Clear to auscultation.No rales or wheezes. Normal Respiratory effort, no retractions.  Heart: NSR.  No murmurs or rubs appreciated. No periferal edema  Abdomen: Soft.  Non-tender.  Uterus firm.  No HSM. No hernia  Extremities: Moves all appropriately.  Normal ROM for age. No lymphadenopathy.  Neuro: Oriented to PPT.  Normal mood. Normal affect.   See [facility] H&P upon admission.  Assessment/Plan: Pt desires perm. Sterilization.  Plan Bilat. Tubal occlusion.  Tubal We have discussed permanent sterilization in detail.  I have reviewed several methods of sterilization, including laparoscopic  fulguration, Filshie clip placement and in cases of postpartum sterilization, open tubal ligation via the modified Pomeroy method.  The risks/benefits of each have been reviewed in detail.  I have stressed the fact that this is a permanent and not reversible procedure and there is a failure rate of 3 to7 per 1000 which is somewhat timing and method dependent.  I have discussed the possibility of inadvertent damage to bowel, bladder, blood vessels or other internal organs..  I have specifically discussed the risk of anesthesia, infection and blood loss with her.  We have discussed the possibility of laparotomy should there be a problem and the additional possibility of a longer hospital stay.  I have made her aware that, despite a negative pregnancy test a few days prior to surgery, there is still a small chance of pregnancy at the time of surgery.  I have spoken with her regarding the recovery period, informing her that after this procedure, most people are performing their normal daily activities within one week.  I have recommended abstinence or another birth control method for 2-4 weeks following this procedure.  Other options of birth control have also been discussed.  Literature regarding permanent sterilization, the procedure of sterilization and alternate methods of birth control were given to the patient.  I have answered all her questions and I believe that she has an adequate and informed understanding of the procedure.   Brennan Bailey 08/13/2017, 3:44 PM

## 2017-08-13 NOTE — Anesthesia Post-op Follow-up Note (Signed)
Anesthesia QCDR form completed.        

## 2017-08-13 NOTE — Anesthesia Procedure Notes (Signed)
Epidural Patient location during procedure: OB  Staffing Anesthesiologist: Yves Dill Resident/CRNA: Mathews Argyle Performed: resident/CRNA   Preanesthetic Checklist Completed: patient identified, site marked, surgical consent, pre-op evaluation, timeout performed, IV checked, risks and benefits discussed and monitors and equipment checked  Epidural Patient position: sitting Prep: ChloraPrep and site prepped and draped Patient monitoring: heart rate, continuous pulse ox and blood pressure Approach: midline Location: L4-L5 Injection technique: LOR saline  Needle:  Needle type: Tuohy  Needle gauge: 17 G Needle length: 9 cm and 9 Needle insertion depth: 8 cm Catheter type: closed end flexible Catheter size: 19 Gauge Catheter at skin depth: 13 cm Test dose: negative and 1.5% lidocaine with Epi 1:200 K  Assessment Sensory level: T10 Events: blood not aspirated, injection not painful, no injection resistance, negative IV test and no paresthesia  Additional Notes   Patient tolerated the insertion well without complications.Reason for block:procedure for pain

## 2017-08-13 NOTE — OB Triage Note (Signed)
Recvd pt from ED for scheduled IOL. Pt has no complaints. Feeling baby move ok.

## 2017-08-13 NOTE — Anesthesia Postprocedure Evaluation (Signed)
Anesthesia Post Note  Patient: Tricia Gregory  Procedure(s) Performed: Procedure(s) (LRB): POST PARTUM TUBAL LIGATION (Bilateral)  Patient location during evaluation: PACU Anesthesia Type: Epidural Level of consciousness: awake and alert Pain management: pain level controlled Vital Signs Assessment: post-procedure vital signs reviewed and stable Respiratory status: spontaneous breathing, nonlabored ventilation, respiratory function stable and patient connected to nasal cannula oxygen Cardiovascular status: blood pressure returned to baseline and stable Postop Assessment: no apparent nausea or vomiting Anesthetic complications: no     Last Vitals:  Vitals:   08/13/17 1815 08/13/17 1824  BP: 136/77 137/67  Pulse: 91 87  Resp: 17 15  Temp: 36.6 C   SpO2: 100% 100%    Last Pain:  Vitals:   08/13/17 1824  TempSrc:   PainSc: 4                  Athel Merriweather S

## 2017-08-13 NOTE — Op Note (Signed)
@  LOGO@    OPERATIVE NOTE 08/13/2017 5:43 PM  PRE-OPERATIVE DIAGNOSIS:  1) post partum desires permanent sterilization  POST-OPERATIVE DIAGNOSIS:  1) same as preop  OPERATION:  Postpartum Filshie clip tubal occlusion for sterilization  SURGEON(S): Surgeon(s) and Role:    Linzie Collin, MD - Primary   ANESTHESIA: Choice  ESTIMATED BLOOD LOSS:  SPECIMEN: * No specimens in log *  COMPLICATIONS: tubal vein bleeding  DISPOSITION: Stable to recovery room  DESCRIPTION OF PROCEDURE:      The patient was prepped and draped in the dorsolithotomy position and placed under epidural anesthesia. The bladder was emptied. A small infraumbilical incision was made and then a systematic dissection was performed through the fascia and peritoneum until we were within the abdominopelvic cavity. Hand-held retractors were placed and we attempted to locate the right fallopian tube. After a few minutes the right tube could not be immediately located so we proceeded to the left. The left fallopian tube was identified and followed out to its fine fimbriated end and then back approximate half the way. At this point the section of tube was elevated on a Babcock clamp and a Filshie clip was used to completely occluded the tube in a perpendicular manner. We then proceeded back to the right and attempted to locate the right fallopian tube.  It was  finally located and grasped with a Babcock clamp. In attempting to follow it out to its end a sub- tubal vein within the right broad ligament was ruptured and began to bleed.  This was noted to be immediately next to the uterus. We extended our abdominal incision for better identification. The bleeding vein was identified clamped with Kelly clamps and systematically suture ligated with figure-of-eight suture. Hemostasis was noted. We were then able to follow the right fallopian tube out to its fine fimbriated end. We then proceeded back approximately half the way  and the right fallopian tube was completely occluded in a perpendicular manner using a Filshie clip. We then carefully inspected the left tube and found the clip to be well applied and hemostatic. We again examined the right fallopian tube found it to be completely occluded and then the suture area was carefully inspected and found to be hemostatic.  Using the suction and lap pads the abdomen was suctioned and cleaned of any remaining blood. The incision was closed with a deep suture through the fascia of 0 Vicryl followed by a 3-0 subcutaneous suture of Vicryl and finally a subcuticular closure of the skin. A long-acting anesthetic was injected.  Steri-Strips were appliedThe patient went to the recovery room in stable condition.   Elonda Husky, M.D. 08/13/2017 5:43 PM

## 2017-08-13 NOTE — Anesthesia Preprocedure Evaluation (Signed)
Anesthesia Evaluation  Patient identified by MRN, date of birth, ID band Patient awake    Reviewed: Allergy & Precautions, NPO status , Patient's Chart, lab work & pertinent test results, reviewed documented beta blocker date and time   Airway Mallampati: III  TM Distance: >3 FB     Dental  (+) Chipped   Pulmonary former smoker,           Cardiovascular      Neuro/Psych    GI/Hepatic   Endo/Other    Renal/GU      Musculoskeletal   Abdominal   Peds  Hematology   Anesthesia Other Findings Obese. On methadone.  Reproductive/Obstetrics                             Anesthesia Physical Anesthesia Plan  ASA: II  Anesthesia Plan:    Post-op Pain Management:    Induction:   PONV Risk Score and Plan:   Airway Management Planned:   Additional Equipment:   Intra-op Plan:   Post-operative Plan:   Informed Consent: I have reviewed the patients History and Physical, chart, labs and discussed the procedure including the risks, benefits and alternatives for the proposed anesthesia with the patient or authorized representative who has indicated his/her understanding and acceptance.     Plan Discussed with: CRNA  Anesthesia Plan Comments:         Anesthesia Quick Evaluation

## 2017-08-13 NOTE — Anesthesia Preprocedure Evaluation (Signed)
Anesthesia Evaluation  Patient identified by MRN, date of birth, ID band Patient awake    Reviewed: Allergy & Precautions, NPO status , Patient's Chart, lab work & pertinent test results  Airway Mallampati: III  TM Distance: >3 FB Neck ROM: full    Dental  (+) Chipped   Pulmonary neg pulmonary ROS, former smoker,           Cardiovascular negative cardio ROS Normal cardiovascular exam     Neuro/Psych  Headaches,    GI/Hepatic Neg liver ROS, GERD  ,  Endo/Other  negative endocrine ROS  Renal/GU negative Renal ROS  negative genitourinary   Musculoskeletal   Abdominal   Peds  Hematology negative hematology ROS (+)   Anesthesia Other Findings   Reproductive/Obstetrics (+) Pregnancy                             Anesthesia Physical Anesthesia Plan  ASA: II  Anesthesia Plan: Epidural   Post-op Pain Management:    Induction:   PONV Risk Score and Plan:   Airway Management Planned:   Additional Equipment:   Intra-op Plan:   Post-operative Plan:   Informed Consent: I have reviewed the patients History and Physical, chart, labs and discussed the procedure including the risks, benefits and alternatives for the proposed anesthesia with the patient or authorized representative who has indicated his/her understanding and acceptance.     Plan Discussed with: Anesthesiologist and CRNA  Anesthesia Plan Comments:         Anesthesia Quick Evaluation

## 2017-08-13 NOTE — Transfer of Care (Signed)
Immediate Anesthesia Transfer of Care Note  Patient: PUNAM BROUSSARD  Procedure(s) Performed: Procedure(s): POST PARTUM TUBAL LIGATION (Bilateral)  Patient Location: PACU  Anesthesia Type:Epidural  Level of Consciousness: awake, alert  and oriented  Airway & Oxygen Therapy: Patient Spontanous Breathing  Post-op Assessment: Report given to RN and Post -op Vital signs reviewed and stable  Post vital signs: Reviewed  Last Vitals:  Vitals:   08/13/17 1745 08/13/17 1746  BP:  (!) 146/81  Pulse:  100  Resp:  14  Temp: (P) 36.7 C 36.5 C  SpO2:  100%    Last Pain:  Vitals:   08/13/17 1510  TempSrc:   PainSc: 0-No pain         Complications: No apparent anesthesia complications

## 2017-08-13 NOTE — H&P (Signed)
Obstetric History and Physical  Tricia Gregory is a 31 y.o. G2P1001 with IUP at [redacted]w[redacted]d presenting for induction of labor due to postdate pregnancy. Patient states she has been having  irregular contractions, none vaginal bleeding, intact membranes, with active fetal movement.    Denies difficulty breathing or respiratory distress, chest pain, abdominal pain, dysuria, and leg pain or swelling.   History significant for tobacco smoking, anemia, and methadone use.   Prenatal Course  Source [facility] Care: [facility]: initial visit: 11 wks, total visits: 16  Pregnancy complications or risks: anemia, smoking, methadone use  Prenatal labs and studies:  ABO, Rh: --/--/O POS (09/14 0645)  Antibody: NEG (09/14 0645)  Rubella: <0.90 (02/05 1030)  Varicella: 1237 (02/05 1030)  RPR: Non Reactive (02/05 1030)   HBsAg: Negative (02/05 1030)   HIV: Non Reactive (02/05 1030)  [id number] (08/09 1132)  1 hr Glucola: 111 (06/14 0915)  Genetic screening: Normal (02/20 1130)  Anatomy US: Normal (04/17 9811)  Past Medical History:  Diagnosis Date  . Amenorrhea   . Substance abuse    started Methadone in 2015    Past Surgical History:  Procedure Laterality Date  . TONSILLECTOMY      OB History  Gravida Para Term Preterm AB Living  SAB TAB Ectopic Multiple Live Births          1    # Outcome Date GA Lbr Len/2nd Weight Sex Delivery Anes PTL Lv  2 Current           1 Term 04/25/07 [redacted]w[redacted]d  8 lb 11.2 oz (3.946 kg) F Vag-Spont  N LIV      Social History   Social History  . Marital status: Married    Spouse name: N/A  . Number of children: N/A  . Years of education: N/A   Social History Main Topics  . Smoking status: Former Smoker    Types: Cigarettes  . Smokeless tobacco: Never Used     Comment: 1-2 cigarettes  . Alcohol use No  . Drug use: No  . Sexual activity: Yes    Partners: Male    Birth control/ protection: None   Other Topics Concern  . None    Social History Narrative  . None    Family History  Problem Relation Age of Onset  . Cancer Maternal Grandmother 50       breast  . Diabetes Maternal Grandfather     Prescriptions Prior to Admission  Medication Sig Dispense Refill Last Dose  . bisacodyl (DULCOLAX) 5 MG EC tablet Take 1 tablet (5 mg total) by mouth daily as needed for moderate constipation. 30 tablet 0 Past Week at Unknown time  . ferrous sulfate 325 (65 FE) MG EC tablet Take 325 mg by mouth 3 (three) times daily with meals.   08/12/2017 at Unknown time  . methadone (DOLOPHINE) 10 MG/5ML solution Take 35 mg by mouth daily.   08/12/2017 at Unknown time  . Prenatal Vit-Fe Fumarate-FA (MULTIVITAMIN-PRENATAL) 27-0.8 MG TABS tablet Take 1 tablet by mouth daily at 12 noon.   08/12/2017 at Unknown time    No Known Allergies  Review of Systems: Negative except for what is mentioned in HPI.  Physical Exam:  Temp:  [98.1 F (36.7 C)-98.3 F (36.8 C)] 98.3 F (36.8 C) (09/14 0706) Pulse Rate:  [79-108] 79 (09/14 0706) Resp:  [16] 16 (09/14 0510) BP: (112)/(65-72) 112/65 (09/14 0706) Weight:  [226 lb (102.5  kg)] 226 lb (102.5 kg) (09/14 0512)  GENERAL: Well-developed, well-nourished female in no acute distress.   LUNGS: Clear to auscultation bilaterally.   HEART: Regular rate and rhythm.  ABDOMEN: Soft, nontender, nondistended, gravid.  EXTREMITIES: Nontender, no edema, 2+ distal pulses.  Cervical Exam: Dilation: 4 Effacement (%): 70 Cervical Position: Posterior Station: -2 Presentation: Vertex Exam by:: Serafina Royals CNM  FHT:  Baseline rate 125 bpm   Variability moderate  Accelerations present   Decelerations none  Contractions: Every three (3) to six (6) minutes, soft resting tone, pitocin 8 mu/min   Pertinent Labs/Studies:    Results for orders placed or performed during the hospital encounter of 08/13/17 (from the past 24 hour(s))  Urine Drug Screen, Qualitative ([facility] only)     Status: Abnormal    Collection Time: 08/13/17  6:17 AM  Result Value Ref Range   Tricyclic, Ur Screen NONE DETECTED NONE DETECTED   Amphetamines, Ur Screen NONE DETECTED NONE DETECTED   MDMA (Ecstasy)Ur Screen NONE DETECTED NONE DETECTED   Cocaine Metabolite,Ur [place] NONE DETECTED NONE DETECTED   Opiate, Ur Screen NONE DETECTED NONE DETECTED   Phencyclidine (PCP) Ur S NONE DETECTED NONE DETECTED   Cannabinoid 50 Ng, Ur  NONE DETECTED NONE DETECTED   Barbiturates, Ur Screen NONE DETECTED NONE DETECTED   Benzodiazepine, Ur Scrn NONE DETECTED NONE DETECTED   Methadone Scn, Ur POSITIVE (A) NONE DETECTED  CBC     Status: Abnormal   Collection Time: 08/13/17  6:45 AM  Result Value Ref Range   WBC 8.7 3.6 - 11.0 K/uL   RBC 3.74 (L) 3.80 - 5.20 MIL/uL   Hemoglobin 10.7 (L) 12.0 - 16.0 g/dL   HCT 40.9 (L) 81.1 - 91.4 %   MCV 83.7 80.0 - 100.0 fL   MCH 28.6 26.0 - 34.0 pg   MCHC 34.2 32.0 - 36.0 g/dL   RDW 78.2 (H) 95.6 - 21.3 %   Platelets 221 150 - 440 K/uL  Type and screen [facility]     Status: None   Collection Time: 08/13/17  6:45 AM  Result Value Ref Range   ABO/RH(D) O POS    Antibody Screen NEG    Sample Expiration 08/16/2017     Assessment :  Tricia Gregory is a 31 y.o. G2P1001 at [redacted]w[redacted]d being admitted for induction of labor due to postdates pregnancy, methadone use, anemia of pregnancy, smoker, Rh positive, GBS negative  FHR Category I  Plan:  Plan of care reviewed with patient. She desires epidural placement then AROM.   Reviewed red flag symptoms and when to call.   Continue orders as written.   Reassess as needed.    Gunnar Bulla, CNM [facility], [facility]

## 2017-08-14 LAB — CBC
HCT: 27.6 % — ABNORMAL LOW (ref 35.0–47.0)
HEMOGLOBIN: 9.5 g/dL — AB (ref 12.0–16.0)
MCH: 29.3 pg (ref 26.0–34.0)
MCHC: 34.5 g/dL (ref 32.0–36.0)
MCV: 84.9 fL (ref 80.0–100.0)
Platelets: 191 10*3/uL (ref 150–440)
RBC: 3.24 MIL/uL — AB (ref 3.80–5.20)
RDW: 14.8 % — ABNORMAL HIGH (ref 11.5–14.5)
WBC: 9.6 10*3/uL (ref 3.6–11.0)

## 2017-08-14 LAB — RPR: RPR Ser Ql: NONREACTIVE

## 2017-08-14 MED ORDER — SIMETHICONE 80 MG PO CHEW
80.0000 mg | CHEWABLE_TABLET | Freq: Four times a day (QID) | ORAL | Status: DC
  Administered 2017-08-14 – 2017-08-15 (×3): 80 mg via ORAL
  Filled 2017-08-14 (×3): qty 1

## 2017-08-14 MED ORDER — FERROUS SULFATE 325 (65 FE) MG PO TABS
325.0000 mg | ORAL_TABLET | Freq: Three times a day (TID) | ORAL | Status: DC
  Administered 2017-08-14 – 2017-08-15 (×2): 325 mg via ORAL
  Filled 2017-08-14 (×2): qty 1

## 2017-08-14 NOTE — Progress Notes (Signed)
Patient ID: Tricia Gregory, female   DOB: 1985/12/23, 31 y.o.   MRN: [id number]     Subjective:    Feels well - ambulating, voiding and eating without problem.  Objective:     Labs: Results for orders placed or performed during the hospital encounter of 08/13/17 (from the past 24 hour(s))  CBC     Status: Abnormal   Collection Time: 08/14/17  7:27 AM  Result Value Ref Range   WBC 9.6 3.6 - 11.0 K/uL   RBC 3.24 (L) 3.80 - 5.20 MIL/uL   Hemoglobin 9.5 (L) 12.0 - 16.0 g/dL   HCT 13.2 (L) 44.0 - 10.2 %   MCV 84.9 80.0 - 100.0 fL   MCH 29.3 26.0 - 34.0 pg   MCHC 34.5 32.0 - 36.0 g/dL   RDW 72.5 (H) 36.6 - 44.0 %   Platelets 191 150 - 440 K/uL    Medications    Current Discharge Medication List    CONTINUE these medications which have NOT CHANGED   Details  bisacodyl (DULCOLAX) 5 MG EC tablet Take 1 tablet (5 mg total) by mouth daily as needed for moderate constipation. Qty: 30 tablet, Refills: 0    ferrous sulfate 325 (65 FE) MG EC tablet Take 325 mg by mouth 3 (three) times daily with meals.    methadone (DOLOPHINE) 10 MG/5ML solution Take 35 mg by mouth daily.    Prenatal Vit-Fe Fumarate-FA (MULTIVITAMIN-PRENATAL) 27-0.8 MG TABS tablet Take 1 tablet by mouth daily at 12 noon.         ABD:  Soft NT  Assessment:    Well post op  Plan:     D/C toradol this AM.   Continue routine PP care.   Elonda Husky, M.D. 08/14/2017 10:09 AM

## 2017-08-14 NOTE — Anesthesia Postprocedure Evaluation (Signed)
Anesthesia Post Note  Patient: Tricia Gregory  Procedure(s) Performed: * No procedures listed *  Patient location during evaluation: Mother Baby Anesthesia Type: Epidural Level of consciousness: awake and alert Pain management: pain level controlled Vital Signs Assessment: post-procedure vital signs reviewed and stable Respiratory status: spontaneous breathing, nonlabored ventilation and respiratory function stable Cardiovascular status: stable Postop Assessment: no headache and no backache Anesthetic complications: no     Last Vitals:  Vitals:   08/14/17 1245 08/14/17 1711  BP: 120/62 (!) 110/59  Pulse: 68 72  Resp: 17 17  Temp: (!) 36.3 C (!) 36.4 C  SpO2:      Last Pain:  Vitals:   08/14/17 1711  TempSrc: Oral  PainSc:                  Lenard Simmer

## 2017-08-14 NOTE — Progress Notes (Signed)
Post Partum Day 1  Subjective:  Patient sitting quietly in bed, family members at bedside holding infant. Patient reports abdominal soreness with movement, but otherwise is "doing fine".   Denies difficulty breathing or respiratory distress, chest pain, excessive vaginal bleeding, dysuria, and leg pain or swelling.   Objective:  Temp:  [97.4 F (36.3 C)-99.2 F (37.3 C)] 97.4 F (36.3 C) (09/15 1245) Pulse Rate:  [67-108] 68 (09/15 1245) Resp:  [13-20] 17 (09/15 1245) BP: (100-146)/(53-81) 120/62 (09/15 1245) SpO2:  [96 %-100 %] 100 % (09/15 0757)  Physical Exam:   General: alert and cooperative  Lochia: appropriate  Uterine Fundus: firm  Perineum: healing well, no significant drainage  DVT Evaluation: No evidence of DVT seen on physical exam. Negative Homan's sign.   Recent Labs  08/13/17 0645 08/14/17 0727  HGB 10.7* 9.5*  HCT 31.3* 27.6*    Assessment/Plan: Plan for discharge tomorrow and Contraception postpartum BTL 08/13/2017   LOS: 1 day   Gunnar Bulla, CNM 08/14/2017, 12:58 PM

## 2017-08-15 ENCOUNTER — Encounter: Payer: Self-pay | Admitting: Obstetrics and Gynecology

## 2017-08-15 MED ORDER — SIMETHICONE 80 MG PO CHEW: 80.0000 mg | CHEWABLE_TABLET | ORAL | 0 refills | Status: DC | PRN

## 2017-08-15 MED ORDER — IBUPROFEN 600 MG PO TABS: 600.0000 mg | ORAL_TABLET | Freq: Four times a day (QID) | ORAL | 0 refills | Status: DC

## 2017-08-15 MED ORDER — SENNOSIDES-DOCUSATE SODIUM 8.6-50 MG PO TABS: 2.0000 | ORAL_TABLET | ORAL | 0 refills | Status: DC

## 2017-08-15 NOTE — Clinical Social Work Maternal (Signed)
  CLINICAL SOCIAL WORK MATERNAL/CHILD NOTE  Patient Details  Name: Tricia Gregory MRN: [id number] Date of Birth: [birth year]  Date:  08/15/2017  Clinical Social Worker Initiating Note:  Santiago Bumpers, MSW, [place] Date/ Time Initiated:  08/15/17/1306     Child's Name:  Tricia Gregory Write   Legal Guardian:  Other (Comment) (Both Mother and Father)   Need for Interpreter:  None   Date of Referral:  08/15/17     Reason for Referral:  Current Substance Use/Substance Use During Pregnancy    Referral Source:  NICU   Address:  [address], [place], [place], [place] [address]  Phone number:  [phone]   Household Members:  Minor Children, Spouse   Natural Supports (not living in the home):  Community, Extended Family, Friends, Immediate Family, Education administrator, Artist Supports: Therapist   Employment: Unemployed   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   Education:  Database administrator Resources:  [employer]   Other Resources:  Physicist, medical , [place] Considerations Which May Impact Care:  None reported  Strengths:  Ability to meet basic needs , Compliance with medical plan , Home prepared for child , Engineer, materials , Understanding of illness   Risk Factors/Current Problems:  Substance Use    Cognitive State:  Able to Concentrate , Alert , Goal Oriented , Insightful , Linear Thinking    Mood/Affect:  Calm , Comfortable , Relaxed    CSW Assessment: The CSW met with the mother and father of the baby at bedside to introduce CSW involvement with NICU admissions and to explain protocol for children experiencing withdrawal. The MOB has been on methadone treatment for the past 2 years through [facility] in [place], and she has titrated down to a 30  Mg dose that she receives daily. The MOB reports no other substance use, and her UDS shows only methadone in her system. The MOB indicates that she has good support from both her  family and her husband's family, and the home is ready for the infant. The couple also have a 30 year old daughter who is excited to be a big sister.  The CSW provided brief psychoeducation about post-partum anxiety and post-partum depression including warning symptoms and raised potential due to the child being admitted to NICU. The CSW also explained how methadone withdrawal can effect a newborn. The MOB states that she plans to bottle feed due to possible transference through breast milk.   The MOB appears to have insight, as does the FOB. No CPS report is necessary at this time as the substance is prescribed. CSW has given the family a list of local resources and will continue to follow while the infant is in NICU. CSW Plan/Description:  Information/Referral to [employer] , Patient/Family Education     Zettie Pho, [place] 08/15/2017, 1:10 PM

## 2017-08-15 NOTE — Discharge Summary (Signed)
Obstetric Discharge Summary  Patient ID: Tricia Gregory MRN: [id number] DOB/AGE: [birth year] 31 y.o.   Date of Admission: 08/13/2017 Gunnar Bulla, CNM Charlena Cross, MD)  Date of Discharge: Gunnar Bulla, CNM Charlena Cross, MD)  Admitting Diagnosis: Induction of labor at [redacted]w[redacted]d  Secondary Diagnosis: Anemia in pregnancy and Methadone use in pregnancy  Mode of Delivery: normal spontaneous vaginal delivery     Discharge Diagnosis: No other diagnosis   Intrapartum Procedures: Atificial rupture of membranes and epidural   Post partum procedures: postpartum tubal ligation  Complications: Second degree perineal laceration   Brief Hospital Course   Tricia Gregory is a G2P1001 who had a SVD on 08/13/2017;  for further details of this birth, please refer to the delivey note.  Patient had an uncomplicated postpartum course.  By time of discharge on PPD#2, her pain was controlled on oral pain medications; she had appropriate lochia and was ambulating, voiding without difficulty and tolerating regular diet.  She was deemed stable for discharge to home.    Labs:  CBC Latest Ref Rng & Units 08/14/2017 08/13/2017 07/08/2017  WBC 3.6 - 11.0 K/uL 9.6 8.7 6.6  Hemoglobin 12.0 - 16.0 g/dL 4.0(J) 10.7(L) 10.5(L)  Hematocrit 35.0 - 47.0 % 27.6(L) 31.3(L) 31.8(L)  Platelets 150 - 440 K/uL 191 221 196   O POS  Physical exam:   Temp:  [97.5 F (36.4 C)-98.6 F (37 C)] 98.4 F (36.9 C) (09/16 1000) Pulse Rate:  [72-99] 77 (09/16 1000) Resp:  [17-20] 20 (09/16 1000) BP: (110-138)/(59-77) 134/66 (09/16 1000) SpO2:  [99 %] 99 % (09/15 1936)  General: alert and no distress  Lochia: appropriate  Abdomen: soft, NT  Uterine Fundus: firm  Perineum: healing well, no significant drainage, no dehiscence, no significant erythema  Extremities: No evidence of DVT seen on physical exam. No lower extremity edema.  Discharge Instructions: Per After Visit Summary.  Activity:  Advance as tolerated. Pelvic rest for 6 weeks.  Also refer to After Visit Summary  Diet: Regular Medications:  Allergies as of 08/15/2017   No Known Allergies     Medication List    TAKE these medications   bisacodyl 5 MG EC tablet Commonly known as:  DULCOLAX Take 1 tablet (5 mg total) by mouth daily as needed for moderate constipation.   ferrous sulfate 325 (65 FE) MG EC tablet Take 325 mg by mouth 3 (three) times daily with meals.   ibuprofen 600 MG tablet Commonly known as:  ADVIL,MOTRIN Take 1 tablet (600 mg total) by mouth every 6 (six) hours.   methadone 10 MG/5ML solution Commonly known as:  DOLOPHINE Take 35 mg by mouth daily.   multivitamin-prenatal 27-0.8 MG Tabs tablet Take 1 tablet by mouth daily at 12 noon.   senna-docusate 8.6-50 MG tablet Commonly known as:  Senokot-S Take 2 tablets by mouth daily.   simethicone 80 MG chewable tablet Commonly known as:  MYLICON Chew 1 tablet (80 mg total) by mouth as needed for flatulence.            Discharge Care Instructions        Start     Ordered   08/16/17 0000  senna-docusate (SENOKOT-S) 8.6-50 MG tablet  Every 24 hours    Question:  Supervising Provider  Answer:  Hildred Laser   08/15/17 1638   08/15/17 0000  ibuprofen (ADVIL,MOTRIN) 600 MG tablet  Every 6 hours    Question:  Supervising Provider  Answer:  Hildred Laser   08/15/17  1624   08/15/17 0000  simethicone (MYLICON) 80 MG chewable tablet  As needed    Question:  Supervising Provider  Answer:  Hildred Laser   08/15/17 1624     Outpatient follow up:  Follow-up Information    Gunnar Bulla, CNM. Call.   Specialties:  Certified Nurse Midwife, Obstetrics and Gynecology, Radiology Why:  Call to scheduled 6 week PPV with Serafina Royals, CNM Contact information: [address] [place] [address] [phone]          Postpartum contraception: bilateral tubal ligation  Discharged Condition:  stable  Discharged to: home  Newborn Data:  Disposition:NICU  Apgars: APGAR (1 MIN): 8   APGAR (5 MINS): 9      Baby Feeding: Bottle    Gunnar Bulla, CNM

## 2017-08-15 NOTE — Progress Notes (Signed)
Discharge instructions reviewed with patient and significant other.  Prescriptions, follow up, and concerns discussed.  Patient denies any further questions.

## 2017-08-15 NOTE — Clinical Social Work Note (Signed)
CSW received consult for drug exposed newborn/Newborn admitted to NICU. CSW will assess when able.  Argentina Ponder, MSW, Theresia Majors [phone]

## 2017-09-24 ENCOUNTER — Encounter: Payer: Medicaid Other | Admitting: Certified Nurse Midwife

## 2017-10-28 ENCOUNTER — Encounter (HOSPITAL_COMMUNITY): Payer: Self-pay

## 2018-10-26 ENCOUNTER — Encounter: Payer: Self-pay | Admitting: Emergency Medicine

## 2018-10-26 ENCOUNTER — Emergency Department
Admission: EM | Admit: 2018-10-26 | Discharge: 2018-10-26 | Disposition: A | Discharge status: Home / Self Care | Payer: Medicaid Other | Attending: Student in an Organized Health Care Education/Training Program | Admitting: Student in an Organized Health Care Education/Training Program

## 2018-10-26 ENCOUNTER — Other Ambulatory Visit: Payer: Self-pay

## 2018-10-26 DIAGNOSIS — L0231 Cutaneous abscess of buttock: Secondary | ICD-10-CM

## 2018-10-26 DIAGNOSIS — L03317 Cellulitis of buttock: Secondary | ICD-10-CM | POA: Insufficient documentation

## 2018-10-26 DIAGNOSIS — Z87891 Personal history of nicotine dependence: Secondary | ICD-10-CM | POA: Insufficient documentation

## 2018-10-26 DIAGNOSIS — Z79899 Other long term (current) drug therapy: Secondary | ICD-10-CM | POA: Insufficient documentation

## 2018-10-26 MED ORDER — ONDANSETRON HCL 4 MG/2ML IJ SOLN
4.0000 mg | Freq: Once | INTRAMUSCULAR | Status: AC
  Administered 2018-10-26: 4 mg via INTRAVENOUS
  Filled 2018-10-26: qty 2

## 2018-10-26 MED ORDER — NAPROXEN 500 MG PO TABS: 500.0000 mg | ORAL_TABLET | Freq: Two times a day (BID) | ORAL | Status: DC

## 2018-10-26 MED ORDER — OXYCODONE-ACETAMINOPHEN 7.5-325 MG PO TABS: 1.0000 | ORAL_TABLET | Freq: Four times a day (QID) | ORAL | 0 refills | Status: DC | PRN

## 2018-10-26 MED ORDER — CLINDAMYCIN PHOSPHATE 600 MG/50ML IV SOLN
600.0000 mg | Freq: Once | INTRAVENOUS | Status: AC
  Administered 2018-10-26: 600 mg via INTRAVENOUS
  Filled 2018-10-26: qty 50

## 2018-10-26 MED ORDER — HYDROMORPHONE HCL 1 MG/ML IJ SOLN
1.0000 mg | Freq: Once | INTRAMUSCULAR | Status: AC
  Administered 2018-10-26: 1 mg via INTRAVENOUS
  Filled 2018-10-26: qty 1

## 2018-10-26 MED ORDER — CLINDAMYCIN HCL 300 MG PO CAPS: 300.0000 mg | ORAL_CAPSULE | Freq: Three times a day (TID) | ORAL | 0 refills | Status: AC

## 2018-10-26 NOTE — ED Provider Notes (Signed)
[facility] Emergency Department Provider Note   ____________________________________________   First MD Initiated Contact with Patient 10/26/18 [phone]     (approximate)  I have reviewed the triage vital signs and the nursing notes.   HISTORY  Chief Complaint No chief complaint on file.    HPI Tricia Gregory is a 32 y.o. female patient presents for edema, erythema, and pain to the right buttocks that started 3 days ago.  Patient has history recurrent cellulitis and pilonidal cyst.  Patient denied fever or drainage.  Patient rates the pain as a 10/10.  Patient described pain as "achy".  Patient pain increases with sitting.  No palliative measures for complaint.   Past Medical History:  Diagnosis Date  . Amenorrhea   . Substance abuse (HCC)    started Methadone in 2015    Patient Active Problem List   Diagnosis Date Noted  . Encounter for induction of labor 08/13/2017  . Constipation in pregnancy in second trimester 04/15/2017  . 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 Smoker 01/19/2017  . Obesity (BMI 30-39.9) 01/19/2017  . Rubella non-immune status, antepartum 01/05/2017    Past Surgical History:  Procedure Laterality Date  . TONSILLECTOMY    . TUBAL LIGATION Bilateral 08/13/2017   Procedure: POST PARTUM TUBAL LIGATION;  Surgeon: Linzie Collin, MD;  Location: [facility] ORS;  Service: Gynecology;  Laterality: Bilateral;    Prior to Admission medications   Medication Sig Start Date End Date Taking? Authorizing Provider  bisacodyl (DULCOLAX) 5 MG EC tablet Take 1 tablet (5 mg total) by mouth daily as needed for moderate constipation. 04/15/17   Lawhorn, Vanessa [place], CNM  clindamycin (CLEOCIN) 300 MG capsule Take 1 capsule (300 mg total) by mouth 3 (three) times daily for 10 days. 10/26/18 11/05/18  Joni Reining, PA-C  ferrous sulfate 325 (65 FE) MG EC tablet Take 325 mg by mouth 3 (three) times daily with meals.    [provider]  ibuprofen (ADVIL,MOTRIN) 600 MG tablet Take 1 tablet (600 mg total) by mouth every 6 (six) hours. 08/15/17   Gunnar Bulla, CNM  methadone (DOLOPHINE) 10 MG/5ML solution Take 35 mg by mouth daily. 02/16/14   [provider]  naproxen (NAPROSYN) 500 MG tablet Take 1 tablet (500 mg total) by mouth 2 (two) times daily with a meal. 10/26/18   Joni Reining, PA-C  oxyCODONE-acetaminophen (PERCOCET) 7.5-325 MG tablet Take 1 tablet by mouth every 6 (six) hours as needed. 10/26/18   Joni Reining, PA-C  Prenatal Vit-Fe Fumarate-FA (MULTIVITAMIN-PRENATAL) 27-0.8 MG TABS tablet Take 1 tablet by mouth daily at 12 noon.    [provider]  senna-docusate (SENOKOT-S) 8.6-50 MG tablet Take 2 tablets by mouth daily. 08/16/17   Lawhorn, Vanessa [place], CNM  simethicone (MYLICON) 80 MG chewable tablet Chew 1 tablet (80 mg total) by mouth as needed for flatulence. 08/15/17   Gunnar Bulla, CNM    Allergies Patient has no known allergies.  Family History  Problem Relation Age of Onset  . Cancer Maternal Grandmother 50       breast  . Diabetes Maternal Grandfather     Social History Social History   Tobacco Use  . Smoking status: Former Smoker    Types: Cigarettes  . Smokeless tobacco: Never Used  . Tobacco comment: 1-2 cigarettes  Substance Use Topics  . Alcohol use: No  . Drug use: No    Review of Systems Constitutional: No fever/chills Eyes: No  visual changes. ENT: No sore throat. Cardiovascular: Denies chest pain. Respiratory: Denies shortness of breath. Gastrointestinal: No abdominal pain.  No nausea, no vomiting.  No diarrhea.  No constipation. Genitourinary: Negative for dysuria. Musculoskeletal: Negative for back pain. Skin: Negative for rash.  Redness and swelling. Neurological: Negative for headaches, focal weakness or numbness.   ____________________________________________   PHYSICAL EXAM:  VITAL SIGNS: ED Triage Vitals  Enc  Vitals Group     BP 10/26/18 0738 121/74     Pulse Rate 10/26/18 0738 (!) 109     Resp 10/26/18 0738 18     Temp 10/26/18 0738 98.2 F (36.8 C)     Temp Source 10/26/18 0738 Oral     SpO2 10/26/18 0738 100 %     Weight 10/26/18 0734 185 lb (83.9 kg)     Height 10/26/18 0734 5\' 2"  (1.575 m)     Head Circumference --      Peak Flow --      Pain Score 10/26/18 0733 10     Pain Loc --      Pain Edu? --      Excl. in GC? --    Constitutional: Alert and oriented.  Moderate distress and crying.   Cardiovascular: Normal rate, regular rhythm. Grossly normal heart sounds.  Good peripheral circulation. Respiratory: Normal respiratory effort.  No retractions. Lungs CTAB. Musculoskeletal: No lower extremity tenderness nor edema.  No joint effusions. Neurologic:  Normal speech and language. No gross focal neurologic deficits are appreciated. No gait instability. Skin: Right buttocks has multiple areas of scar tissue.  Edema and erythema right buttocks.  No fluctuant lesion.  Psychiatric: Mood and affect are normal. Speech and behavior are normal.  ____________________________________________   LABS (all labs ordered are listed, but only abnormal results are displayed)  Labs Reviewed - No data to display ____________________________________________  EKG   ____________________________________________  RADIOLOGY  ED MD interpretation:    Official radiology report(s): No results found.  ____________________________________________   PROCEDURES  Procedure(s) performed: None  Procedures  Critical Care performed: No  ____________________________________________   INITIAL IMPRESSION / ASSESSMENT AND PLAN / ED COURSE  As part of my medical decision making, I reviewed the following data within the electronic MEDICAL RECORD NUMBER    Cellulitis right buttocks.  Patient given IV clindamycin, Dilaudid, and Zofran.  Patient will be given discharge care instructions.  Patient given a  prescription for clindamycin, Percocet, and naproxen.  Patient advised to follow-up with the surgical clinic if no improvement in 5 days.      ____________________________________________   FINAL CLINICAL IMPRESSION(S) / ED DIAGNOSES  Final diagnoses:  Cellulitis and abscess of buttock     ED Discharge Orders         Ordered    clindamycin (CLEOCIN) 300 MG capsule  3 times daily     10/26/18 0758    oxyCODONE-acetaminophen (PERCOCET) 7.5-325 MG tablet  Every 6 hours PRN     10/26/18 0758    naproxen (NAPROSYN) 500 MG tablet  2 times daily with meals     10/26/18 0758           Note:  This document was prepared using Dragon voice recognition software and may include unintentional dictation errors.    Joni ReiningSmith, Wiley Flicker K, PA-C 10/26/18 [id number]    Willy Eddyobinson, Patrick, MD 10/26/18 [phone]

## 2018-10-26 NOTE — Discharge Instructions (Addendum)
Follow discharge care instruction take medication as directed.  Follow-up with the surgical clinic if no improvement in 5 days.

## 2018-10-26 NOTE — ED Notes (Signed)
See triage note  Presents with possible abscess area to buttocks  States she noticed area on Sunday  Has been using warm compresses to area

## 2018-10-26 NOTE — ED Triage Notes (Signed)
Pt states boil on inside of right buttock cheek that began hurting on Sunday, states possibly more than one denies drainage. NAD.

## 2020-03-21 ENCOUNTER — Emergency Department
Admission: EM | Admit: 2020-03-21 | Discharge: 2020-03-21 | Disposition: A | Discharge status: Home / Self Care | Payer: Self-pay | Attending: Student | Admitting: Student

## 2020-03-21 ENCOUNTER — Encounter: Payer: Self-pay | Admitting: Emergency Medicine

## 2020-03-21 ENCOUNTER — Other Ambulatory Visit: Payer: Self-pay

## 2020-03-21 DIAGNOSIS — Z87891 Personal history of nicotine dependence: Secondary | ICD-10-CM | POA: Insufficient documentation

## 2020-03-21 DIAGNOSIS — J0140 Acute pansinusitis, unspecified: Secondary | ICD-10-CM | POA: Insufficient documentation

## 2020-03-21 MED ORDER — PREDNISONE 10 MG PO TABS: ORAL_TABLET | ORAL | 0 refills | Status: DC

## 2020-03-21 MED ORDER — FLUTICASONE PROPIONATE 50 MCG/ACT NA SUSP: 2.0000 | Freq: Every day | NASAL | 0 refills | Status: AC

## 2020-03-21 MED ORDER — AMOXICILLIN-POT CLAVULANATE 875-125 MG PO TABS: 1.0000 | ORAL_TABLET | Freq: Two times a day (BID) | ORAL | 0 refills | Status: AC

## 2020-03-21 NOTE — ED Notes (Signed)
Pt with congestion x 3 days, mostly clear drainage per pt. Pt states sinus pressure as well. Pt states she has taken claritin and mucinex nasal spray but still has sinus pressure.

## 2020-03-21 NOTE — ED Provider Notes (Signed)
[facility] Emergency Department Provider Note   ____________________________________________   First MD Initiated Contact with Patient 03/21/20 1132     (approximate)  I have reviewed the triage vital signs and the nursing notes.   HISTORY  Chief Complaint Facial Pain and Nasal Congestion   HPI Tricia Gregory is a 34 y.o. female presents to the ED with complaint of left-sided sinus pressure, congestion for the last 3 days.  Patient states that even her teeth hurt on the left side.  Patient has been using Mucinex and over-the-counter products without any relief.  She states that when she use the Mucinex spray last evening she got some nasal congestion out.  She also has muffled hearing in her left ear.  She denies any fever or chills.  Patient is a smoker.  She rates her pain as an 8 out of 10.     Past Medical History:  Diagnosis Date  . Amenorrhea   . Substance abuse ([place])    started Methadone in 2015    Patient Active Problem List   Diagnosis Date Noted  . Encounter for induction of labor 08/13/2017  . Constipation in pregnancy in second trimester 04/15/2017  . Methadone dependence ([place]) 01/19/2017  . Smoker 01/19/2017  . Obesity (BMI 30-39.9) 01/19/2017  . Rubella non-immune status, antepartum 01/05/2017    Past Surgical History:  Procedure Laterality Date  . TONSILLECTOMY    . TUBAL LIGATION Bilateral 08/13/2017   Procedure: POST PARTUM TUBAL LIGATION;  Surgeon: Harlin Heys, MD;  Location: [facility] ORS;  Service: Gynecology;  Laterality: Bilateral;    Prior to Admission medications   Medication Sig Start Date End Date Taking? Authorizing Provider  amoxicillin-clavulanate (AUGMENTIN) 875-125 MG tablet Take 1 tablet by mouth 2 (two) times daily for 10 days. 03/21/20 03/31/20  Johnn Hai, PA-C  bisacodyl (DULCOLAX) 5 MG EC tablet Take 1 tablet (5 mg total) by mouth daily as needed for moderate constipation. 04/15/17   Lawhorn,  Lara Mulch, CNM  ferrous sulfate 325 (65 FE) MG EC tablet Take 325 mg by mouth 3 (three) times daily with meals.    [provider]  fluticasone (FLONASE) 50 MCG/ACT nasal spray Place 2 sprays into both nostrils daily. 03/21/20 03/21/21  Johnn Hai, PA-C  ibuprofen (ADVIL,MOTRIN) 600 MG tablet Take 1 tablet (600 mg total) by mouth every 6 (six) hours. 08/15/17   Diona Fanti, CNM  methadone (DOLOPHINE) 10 MG/5ML solution Take 35 mg by mouth daily. 02/16/14   [provider]  predniSONE (DELTASONE) 10 MG tablet Take 6 tablets  today, on day 2 take 5 tablets, day 3 take 4 tablets, day 4 take 3 tablets, day 5 take  2 tablets and 1 tablet the last day 03/21/20   Johnn Hai, PA-C  Prenatal Vit-Fe Fumarate-FA (MULTIVITAMIN-PRENATAL) 27-0.8 MG TABS tablet Take 1 tablet by mouth daily at 12 noon.    [provider]  simethicone (MYLICON) 80 MG chewable tablet Chew 1 tablet (80 mg total) by mouth as needed for flatulence. 08/15/17 03/21/20  Diona Fanti, CNM    Allergies Patient has no known allergies.  Family History  Problem Relation Age of Onset  . Cancer Maternal Grandmother 50       breast  . Diabetes Maternal Grandfather     Social History Social History   Tobacco Use  . Smoking status: Former Smoker    Types: Cigarettes  . Smokeless tobacco: Never Used  . Tobacco  comment: 1-2 cigarettes  Substance Use Topics  . Alcohol use: No  . Drug use: No    Review of Systems Constitutional: No fever/chills Eyes: No visual changes. ENT: No sore throat.  Positive nasal congestion.  Positive left ear pain. Cardiovascular: Denies chest pain. Respiratory: Denies shortness of breath.  Negative for cough. Gastrointestinal: No abdominal pain.  No nausea, no vomiting.  Musculoskeletal: Negative for muscle aches. Skin: Negative for rash. Neurological: Negative for headaches, focal weakness or  numbness. ____________________________________________   PHYSICAL EXAM:  VITAL SIGNS: ED Triage Vitals  Enc Vitals Group     BP 03/21/20 1117 (!) 142/88     Pulse Rate 03/21/20 1117 80     Resp 03/21/20 1117 18     Temp 03/21/20 1117 97.6 F (36.4 C)     Temp Source 03/21/20 1117 Oral     SpO2 03/21/20 1117 100 %     Weight 03/21/20 1119 165 lb (74.8 kg)     Height 03/21/20 1119 5\' 2"  (1.575 m)     Head Circumference --      Peak Flow --      Pain Score 03/21/20 1119 8     Pain Loc --      Pain Edu? --      Excl. in GC? --     Constitutional: Alert and oriented. Well appearing and in no acute distress. Eyes: Conjunctivae are normal. PERRL. EOMI. Head: Atraumatic. Nose: Minimal congestion/rhinnorhea.  Right EAC is clear.  TMs are dull bilaterally with the left TM having fluid level and poor light reflex.  No erythema or injection was seen.  Moderate tenderness on percussion of the left frontal and maxillary sinus. Mouth/Throat: Mucous membranes are moist.  Oropharynx non-erythematous. Neck: No stridor. Cardiovascular: Normal rate, regular rhythm. Grossly normal heart sounds.  Good peripheral circulation. Respiratory: Normal respiratory effort.  No retractions. Lungs CTAB. Musculoskeletal: Moves upper and lower extremities with any difficulty normal gait was noted. Neurologic:  Normal speech and language. No gross focal neurologic deficits are appreciated. No gait instability. Skin:  Skin is warm, dry and intact. No rash noted. Psychiatric: Mood and affect are normal. Speech and behavior are normal.  ____________________________________________   LABS (all labs ordered are listed, but only abnormal results are displayed)  Labs Reviewed - No data to display  PROCEDURES  Procedure(s) performed (including Critical Care):  Procedures   ____________________________________________   INITIAL IMPRESSION / ASSESSMENT AND PLAN / ED COURSE  As part of my medical decision  making, I reviewed the following data within the electronic MEDICAL RECORD NUMBER Notes from prior ED visits and [place] Controlled Substance Database  34 year old female presents to the ED with complaint of left-sided facial pain and her teeth hurting for 3 days.  Patient has had nasal congestion and has been using over-the-counter sinus medication.  She states that last evening she use Mucinex nasal spray and got a lot of congestion out of her nose.  She denies any fever or chills.  There is been no exposure to Covid.  On exam she is moderately tender to percussion of the sinus areas consistent with a sinusitis.  Patient was started on Augmentin, Flonase nasal spray and prednisone.  Patient is to follow-up with Dr. 34 if any continued problems.  ____________________________________________   FINAL CLINICAL IMPRESSION(S) / ED DIAGNOSES  Final diagnoses:  Acute non-recurrent pansinusitis     ED Discharge Orders         Ordered    amoxicillin-clavulanate (AUGMENTIN)  875-125 MG tablet  2 times daily     03/21/20 1215    fluticasone (FLONASE) 50 MCG/ACT nasal spray  Daily     03/21/20 1215    predniSONE (DELTASONE) 10 MG tablet     03/21/20 1215           Note:  This document was prepared using Dragon voice recognition software and may include unintentional dictation errors.    Tommi Rumps, PA-C 03/21/20 1255    Miguel Aschoff., MD 03/21/20 1539

## 2020-03-21 NOTE — ED Triage Notes (Signed)
Pt states sinus pressure, congestion and pain on the left side of her face, pain hurts to her teeth per pt. NAD, pt tearful in triage. Denies covid exposure.

## 2021-12-11 ENCOUNTER — Emergency Department: Payer: BLUE CROSS/BLUE SHIELD

## 2021-12-11 ENCOUNTER — Emergency Department
Admission: EM | Admit: 2021-12-11 | Discharge: 2021-12-11 | Disposition: A | Discharge status: Home / Self Care | Payer: BLUE CROSS/BLUE SHIELD | Attending: Emergency Medicine | Admitting: Emergency Medicine

## 2021-12-11 ENCOUNTER — Other Ambulatory Visit: Payer: Self-pay

## 2021-12-11 DIAGNOSIS — S4990XA Unspecified injury of shoulder and upper arm, unspecified arm, initial encounter: Secondary | ICD-10-CM | POA: Diagnosis not present

## 2021-12-11 DIAGNOSIS — Y92009 Unspecified place in unspecified non-institutional (private) residence as the place of occurrence of the external cause: Secondary | ICD-10-CM | POA: Insufficient documentation

## 2021-12-11 DIAGNOSIS — R9431 Abnormal electrocardiogram [ECG] [EKG]: Secondary | ICD-10-CM | POA: Diagnosis not present

## 2021-12-11 DIAGNOSIS — W010XXA Fall on same level from slipping, tripping and stumbling without subsequent striking against object, initial encounter: Secondary | ICD-10-CM | POA: Insufficient documentation

## 2021-12-11 DIAGNOSIS — S299XXA Unspecified injury of thorax, initial encounter: Secondary | ICD-10-CM | POA: Diagnosis present

## 2021-12-11 MED ORDER — HYDROCODONE-ACETAMINOPHEN 5-325 MG PO TABS: 1.0000 | ORAL_TABLET | Freq: Four times a day (QID) | ORAL | 0 refills | Status: AC | PRN

## 2021-12-11 MED ORDER — CYCLOBENZAPRINE HCL 10 MG PO TABS: 10.0000 mg | ORAL_TABLET | Freq: Three times a day (TID) | ORAL | 0 refills | Status: DC | PRN

## 2021-12-11 MED ORDER — OXYCODONE-ACETAMINOPHEN 5-325 MG PO TABS
1.0000 | ORAL_TABLET | ORAL | Status: DC | PRN
  Administered 2021-12-11: 1 via ORAL
  Filled 2021-12-11: qty 1

## 2021-12-11 MED ORDER — NAPROXEN 500 MG PO TABS: 500.0000 mg | ORAL_TABLET | Freq: Two times a day (BID) | ORAL | 0 refills | Status: DC

## 2021-12-11 NOTE — ED Notes (Signed)
Pt transported back from xray via WC with xray tech.

## 2021-12-11 NOTE — ED Triage Notes (Signed)
Pt to ED via POV from home. Pt stating she fell last Friday on her left side. Pt endorses left epigastric, shoulder and rib pain that has been getting worse. Pt denies blood thinners. Pt A&Ox4.

## 2021-12-11 NOTE — ED Notes (Signed)
D/C and reasons to return to ED discussed with pt, pt verbalized understanding. Pt ambulatory with steady gait on D/C. NAD noted.  

## 2021-12-11 NOTE — ED Provider Notes (Signed)
[facility] Provider Note    Event Date/Time   First MD Initiated Contact with Patient 12/11/21 1120     (approximate)   History   Fall (Left rib, back, shoulder pain)   HPI  Tricia Gregory is a 36 y.o. female  who presents to the emergency department for evaluation after fall last Friday, landing on her left side.  She tripped over a toy when she got up to go to the bathroom and landed with her arm tucked against her side. No relief with Aleve. Movement, breathing, make the pain worse. She denies loss of consciousness.      Physical Exam   Triage Vital Signs: ED Triage Vitals  Enc Vitals Group     BP 12/11/21 1102 (!) 150/84     Pulse Rate 12/11/21 1102 (!) 105     Resp 12/11/21 1102 20     Temp 12/11/21 1102 98 F (36.7 C)     Temp Source 12/11/21 1102 Oral     SpO2 12/11/21 1102 100 %     Weight 12/11/21 1103 165 lb (74.8 kg)     Height 12/11/21 1103 5\' 2"  (1.575 m)     Head Circumference --      Peak Flow --      Pain Score 12/11/21 1102 9     Pain Loc --      Pain Edu? --      Excl. in GC? --     Most recent vital signs: Vitals:   12/11/21 1102  BP: (!) 150/84  Pulse: (!) 105  Resp: 20  Temp: 98 F (36.7 C)  SpO2: 100%     General: Awake, no distress.  CV:  Good peripheral perfusion.  Resp:  Normal effort.  Abd:  No distention.  Other:     ED Results / Procedures / Treatments   Labs (all labs ordered are listed, but only abnormal results are displayed) Labs Reviewed - No data to display   EKG  ED ECG REPORT I, Jayd Forrey, FNP-BC personally viewed and interpreted this ECG.   Date: 12/11/2021  EKG Time: 1113  Rate: 98  Rhythm: normal EKG, normal sinus rhythm, nonspecific ST and T waves changes  Axis: normal  Intervals:none  ST&T Change: Nonspecific T wave abnormality    RADIOLOGY Image reviewed by me. Question abnormality of 12th rib on left. Radiology reading is negative for acute bony  abnormality.   PROCEDURES:  Critical Care performed: No  Procedures   MEDICATIONS ORDERED IN ED: Medications  oxyCODONE-acetaminophen (PERCOCET/ROXICET) 5-325 MG per tablet 1 tablet (1 tablet Oral Given 12/11/21 1109)     IMPRESSION / MDM / ASSESSMENT AND PLAN / ED COURSE  I reviewed the triage vital signs and the nursing notes.                              Differential diagnosis includes, but is not limited to, rib fracture, rib contusion   36 year old female presenting to the emergency department for treatment and evaluation 6 days after a mechanical, nonsyncopal fall at home.  See HPI for further details.  Exam is overall reassuring.  She is tender in the left lateral thorax, however there is no flail segment, contusions, or open wounds.  Breath sounds are clear to auscultation.  Radiology reading of chest imaging is negative for acute concerns.   Discharge medications discussed with patient. 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 Will send a few norco along with flexeril and naprosyn. Medication warnings discussed. She is to follow up with primary care for symptoms not improving over the week. ER return precautions discussed.      FINAL CLINICAL IMPRESSION(S) / ED DIAGNOSES   Final diagnoses:  Traumatic injury of rib     Rx / DC Orders   ED Discharge Orders          Ordered    HYDROcodone-acetaminophen (NORCO/VICODIN) 5-325 MG tablet  Every 6 hours PRN        12/11/21 1201    cyclobenzaprine (FLEXERIL) 10 MG tablet  3 times daily PRN        12/11/21 1201    naproxen (NAPROSYN) 500 MG tablet  2 times daily with meals        12/11/21 1201             Note:  This document was prepared using Dragon voice recognition software and may include unintentional dictation errors.   Chinita Pester, FNP 12/11/21 1216    Gilles Chiquito, MD 12/11/21 1721

## 2021-12-11 NOTE — Discharge Instructions (Signed)
Apply ice over sore areas off and on throughout the day.  Do not drive or operate machinery while taking pain medication or muscle relaxers.  Follow up with primary care or return to the ER for symptoms of concern.

## 2021-12-11 NOTE — ED Notes (Signed)
Pt c/o L side pain since Friday after a fall. Pt reports tripping over her son's toy & falling; states "it knocked the wind out of me".

## 2022-08-31 ENCOUNTER — Emergency Department
Admission: EM | Admit: 2022-08-31 | Discharge: 2022-08-31 | Disposition: A | Discharge status: Home / Self Care | Payer: BLUE CROSS/BLUE SHIELD | Attending: Emergency Medicine | Admitting: Emergency Medicine

## 2022-08-31 ENCOUNTER — Other Ambulatory Visit: Payer: Self-pay

## 2022-08-31 DIAGNOSIS — K0889 Other specified disorders of teeth and supporting structures: Secondary | ICD-10-CM | POA: Diagnosis present

## 2022-08-31 DIAGNOSIS — K047 Periapical abscess without sinus: Secondary | ICD-10-CM | POA: Diagnosis not present

## 2022-08-31 MED ORDER — IBUPROFEN 600 MG PO TABS: 600.0000 mg | ORAL_TABLET | Freq: Four times a day (QID) | ORAL | 0 refills | Status: DC | PRN

## 2022-08-31 MED ORDER — AMOXICILLIN 875 MG PO TABS: 875.0000 mg | ORAL_TABLET | Freq: Two times a day (BID) | ORAL | 0 refills | Status: DC

## 2022-08-31 NOTE — ED Triage Notes (Signed)
First Nurse Note:  C/O bump to roof of mouth x 2 days.  Today area became more swollen and patient has been c/o body aches and chills.  Also sinus pressure  AAOx3.  Skin warm and dry.  NAD  Voice clear and strong.

## 2022-08-31 NOTE — ED Triage Notes (Signed)
Pt states also had fever today but not sure how high because no thermometer at home. Pt states she had had chills today.

## 2022-08-31 NOTE — Discharge Instructions (Signed)
OPTIONS FOR DENTAL FOLLOW UP CARE ° °[place] Department of Health and Human Services - Local Safety Net Dental Clinics °http://www.ncdhhs.gov/dph/oralhealth/services/safetynetclinics.htm °  °[facility] (336-562-3123) ° °[facility] [place] (919-933-9087) ° °[facility] ([phone] ext [phone]) ° °[facility] ([phone]) ° °[facility] ([phone]) °This clinic caters to the indigent population and is on a lottery system. °Location: °[facility], [address], [place] °Clinic Hours: °Wednesdays from 6pm - 9pm, patients seen by a lottery system. °For dates, call or go to www.med.[facility].edu/[facility]/patients/Dental-[facility] °Services: °Cleanings, fillings and simple extractions. °Payment Options: °DENTAL WORK IS FREE OF CHARGE. Bring proof of income or support. °Best way to get seen: °Arrive at 5:15 pm - this is a lottery, NOT first come/first serve, so arriving earlier will not increase your chances of being seen. °  °  °[facility] °[phone] °Select option 1 for emergencies °  °Location: °[facility], [address], [place] °Clinic Hours: °No walk-ins accepted - call the day before to schedule an appointment. °Check in times are 9:30 am and 1:30 pm. °Services: °Simple extractions, temporary fillings, pulpectomy/pulp debridement, uncomplicated abscess drainage. °Payment Options: °PAYMENT IS DUE AT THE TIME OF SERVICE.  Fee is usually $100-200, additional surgical procedures (e.g. abscess drainage) may be extra. °Cash, checks, Visa/MasterCard accepted.  Can file Medicaid if patient is covered for dental - patient should call case worker to check. °No discount for [facility] patients. °Best way to get seen: °MUST call the day before and get onto the schedule. Can usually be seen the next 1-2 days. No walk-ins accepted. °  °  °[facility] °[phone] °   °Location: °[facility], [address], [place] °Clinic Hours: °M, W, Th, F 8am or 1:30pm, Tues 9a or 1:30 - first come/first served. °Services: °Simple extractions, temporary fillings, uncomplicated abscess drainage.  You do not need to be an [place] resident. °Payment Options: °PAYMENT IS DUE AT THE TIME OF SERVICE. °Dental insurance, otherwise sliding scale - bring proof of income or support. °Depending on income and treatment needed, cost is usually $50-200. °Best way to get seen: °Arrive early as it is first come/first served. °  °  °[facility] °[phone] °  °Location: °[address] °Clinic Hours: °Mon-Thu 8a-5p °Services: °Most basic dental services including extractions and fillings. °Payment Options: °PAYMENT IS DUE AT THE TIME OF SERVICE. °Sliding scale, up to 50% off - bring proof if income or support. °Medicaid with dental option accepted. °Best way to get seen: °Call to schedule an appointment, can usually be seen within 2 weeks OR they will try to see walk-ins - show up at 8a or 2p (you may have to wait). °  °  °[facility] °919-245-2435 °[place] RESIDENTS ONLY °  °Location: °[facility], [address], [place], [place] [address] °Clinic Hours: By appointment only. °Monday - Thursday 8am-5pm, Friday 8am-12pm °Services: Cleanings, fillings, extractions. °Payment Options: °PAYMENT IS DUE AT THE TIME OF SERVICE. °Cash, Visa or MasterCard. Sliding scale - $30 minimum per service. °Best way to get seen: °Come in to office, complete packet and make an appointment - need proof of income °or support monies for each household member and proof of [place] residence. °Usually takes about a month to get in. °  °  °[facility] °[phone] °  °Location: °[address]   [place] °Clinic Hours: Walk-in Urgent Care Dental Services are offered Monday-Friday  mornings only. °The numbers of emergencies accepted daily is limited to the number of °providers available. °Maximum 15 - Mondays, Wednesdays & Thursdays °Maximum 10 - Tuesdays & Fridays °Services: °You do not need to be a [place] resident to be seen for a dental emergency. °Emergencies are defined as pain, swelling, abnormal bleeding, or dental trauma. Walkins will receive x-rays if needed. °NOTE: Dental cleaning is not an emergency. °Payment Options: °PAYMENT IS DUE AT THE TIME OF SERVICE. °Minimum co-pay is $40.00 for uninsured patients. °Minimum co-pay is $3.00 for Medicaid with dental coverage. °Dental Insurance is accepted and must be presented at time of visit. °Medicare does not cover dental. °Forms of payment: Cash, credit card, checks. °Best way to get seen: °If not previously registered with the clinic, walk-in dental registration begins at 7:15 am and is on a first come/first serve basis. °If previously registered with the clinic, call to make an appointment. °  °  °[facility] °[phone] °[place] RESIDENTS ONLY °  °Location: °[address], [place], [place] °Clinic Hours: °Mon-Thu 10a-2p °Services: Extractions only! °Payment Options: °FREE (donations accepted) - bring proof of income or support °Best way to get seen: °Call and schedule an appointment OR come at 8am on the 1st Monday of every month (except for holidays) when it is first come/first served. °  °  °[facility] °[phone] °  °Location: °[address],  °Clinic Hours: °Friday mornings °Services, Payment Options, Best way to get seen: °Call for info °

## 2022-08-31 NOTE — ED Provider Notes (Signed)
[facility] Provider Note    Event Date/Time   First MD Initiated Contact with Patient 08/31/22 1807     (approximate)   History   Fever and Dental Pain   HPI  Tricia Gregory is a 36 y.o. female with history of substance abuse presents emergency department complaining of right upper tooth pain/swelling.  Patient did have a fever.  Has taken Tylenol and ibuprofen without any relief.  States area is very swollen at the gum.  No chest pain or shortness of breath      Physical Exam   Triage Vital Signs: ED Triage Vitals  Enc Vitals Group     BP 08/31/22 1803 (!) 143/92     Pulse Rate 08/31/22 1803 80     Resp 08/31/22 1803 18     Temp 08/31/22 1803 98.5 F (36.9 C)     Temp Source 08/31/22 1803 Oral     SpO2 08/31/22 1803 99 %     Weight 08/31/22 1802 168 lb (76.2 kg)     Height 08/31/22 1802 5\' 2"  (1.575 m)     Head Circumference --      Peak Flow --      Pain Score 08/31/22 1802 10     Pain Loc --      Pain Edu? --      Excl. in GC? --     Most recent vital signs: Vitals:   08/31/22 1803  BP: (!) 143/92  Pulse: 80  Resp: 18  Temp: 98.5 F (36.9 C)  SpO2: 99%     General: Awake, no distress.   CV:  Good peripheral perfusion. regular rate and  rhythm Resp:  Normal effort. Lungs CTA Abd:  No distention.   Other:  Right upper posterior molar swollen gumline, possible fractured tooth at the gumline, appears to be abscess   ED Results / Procedures / Treatments   Labs (all labs ordered are listed, but only abnormal results are displayed) Labs Reviewed - No data to display   EKG     RADIOLOGY     PROCEDURES:   Procedures   MEDICATIONS ORDERED IN ED: Medications - No data to display   IMPRESSION / MDM / ASSESSMENT AND PLAN / ED COURSE  I reviewed the triage vital signs and the nursing notes.                              Differential diagnosis includes, but is not limited to, dental abscess, dental pain,  dental caries  Patient's presentation is most consistent with acute, uncomplicated illness.   I did explain everything to the patient.  She was given a prescription for amoxicillin and ibuprofen.  She is to follow-up with her regular doctor or dentist.  She was also given a list of discounted dental clinics.  Return emergency department worsening.  Patient agreement treatment plan.      FINAL CLINICAL IMPRESSION(S) / ED DIAGNOSES   Final diagnoses:  Dental abscess     Rx / DC Orders   ED Discharge Orders          Ordered    amoxicillin (AMOXIL) 875 MG tablet  2 times daily        08/31/22 1808    ibuprofen (ADVIL) 600 MG tablet  Every 6 hours PRN        08/31/22 1808  Note:  This document was prepared using Dragon voice recognition software and may include unintentional dictation errors.    Versie Starks, PA-C 08/31/22 Megan Salon    Duffy Bruce, MD 08/31/22 [phone]

## 2022-08-31 NOTE — ED Notes (Signed)
Dc instructions and scripts reviewed with pt no questions or concerns at this time. Will follow up with dentist.

## 2022-08-31 NOTE — ED Triage Notes (Signed)
Pt took ibuprofen 400mg  at 1600

## 2022-09-07 ENCOUNTER — Encounter: Payer: Self-pay | Admitting: Certified Nurse Midwife

## 2022-09-11 ENCOUNTER — Encounter: Payer: Self-pay | Admitting: Certified Nurse Midwife

## 2022-11-19 ENCOUNTER — Emergency Department
Admission: EM | Admit: 2022-11-19 | Discharge: 2022-11-19 | Disposition: A | Discharge status: Home / Self Care | Payer: BLUE CROSS/BLUE SHIELD | Attending: Emergency Medicine | Admitting: Emergency Medicine

## 2022-11-19 ENCOUNTER — Other Ambulatory Visit: Payer: Self-pay

## 2022-11-19 ENCOUNTER — Encounter: Payer: Self-pay | Admitting: Emergency Medicine

## 2022-11-19 DIAGNOSIS — R82998 Other abnormal findings in urine: Secondary | ICD-10-CM | POA: Diagnosis present

## 2022-11-19 DIAGNOSIS — B9689 Other specified bacterial agents as the cause of diseases classified elsewhere: Secondary | ICD-10-CM | POA: Diagnosis not present

## 2022-11-19 DIAGNOSIS — N390 Urinary tract infection, site not specified: Secondary | ICD-10-CM | POA: Diagnosis not present

## 2022-11-19 LAB — URINALYSIS, ROUTINE W REFLEX MICROSCOPIC
Bilirubin Urine: NEGATIVE
Glucose, UA: NEGATIVE mg/dL
Ketones, ur: NEGATIVE mg/dL
Nitrite: NEGATIVE
Protein, ur: 100 mg/dL — AB
RBC / HPF: >50 RBC/hpf — ABNORMAL HIGH (ref 0–5)
Specific Gravity, Urine: 1.019 (ref 1.005–1.030)
WBC, UA: >50 WBC/hpf — ABNORMAL HIGH (ref 0–5)
pH: 6 (ref 5.0–8.0)

## 2022-11-19 LAB — POC URINE PREG, ED: Preg Test, Ur: NEGATIVE

## 2022-11-19 MED ORDER — PHENAZOPYRIDINE HCL 200 MG PO TABS: 200.0000 mg | ORAL_TABLET | Freq: Three times a day (TID) | ORAL | 0 refills | Status: AC | PRN

## 2022-11-19 MED ORDER — CEFDINIR 300 MG PO CAPS: 300.0000 mg | ORAL_CAPSULE | Freq: Two times a day (BID) | ORAL | 0 refills | Status: AC

## 2022-11-19 NOTE — Discharge Instructions (Addendum)
Follow-up with your primary care provider or urgent care if any continued problems.  Return to the emergency department if any severe worsening of your symptoms over the Christmas holiday.  Increase fluids.  2 prescriptions were sent to the pharmacy 1 is for frequency and burning which will turn your urine a fluorescent yellowish-orange color which is temporary.  The other is the antibiotic to be taken twice a day until completely finished.  You may take Tylenol if needed for pain.

## 2022-11-19 NOTE — ED Notes (Signed)
See triage note Presents with some urinary freq and dysuria which started about 1 week ago States she then developed right flank pain this am  No fever or n/v

## 2022-11-19 NOTE — ED Provider Notes (Signed)
[facility] Provider Note    Event Date/Time   First MD Initiated Contact with Patient 11/19/22 0745     (approximate)   History   Flank Pain   HPI  Tricia Gregory is a 36 y.o. female presents to the ED with complaint of urinary symptoms for approximately 1 week.  Patient states that she began taking over-the-counter cranberry tablets thinking that this would help.  She states that her urine has been cloudy and this morning she noticed a pink discoloration to it which was worrisome.  She denies any nausea, vomiting, fever or chills.  Patient denies any previous kidney stones.     Physical Exam   Triage Vital Signs: ED Triage Vitals  Enc Vitals Group     BP 11/19/22 0736 (!) 133/96     Pulse Rate 11/19/22 0736 82     Resp 11/19/22 0736 16     Temp 11/19/22 0736 97.9 F (36.6 C)     Temp Source 11/19/22 0736 Oral     SpO2 11/19/22 0736 100 %     Weight 11/19/22 0737 170 lb (77.1 kg)     Height 11/19/22 0737 5\' 2"  (1.575 m)     Head Circumference --      Peak Flow --      Pain Score 11/19/22 0737 4     Pain Loc --      Pain Edu? --      Excl. in GC? --     Most recent vital signs: Vitals:   11/19/22 0736  BP: (!) 133/96  Pulse: 82  Resp: 16  Temp: 97.9 F (36.6 C)  SpO2: 100%     General: Awake, no distress.  CV:  Good peripheral perfusion.  Heart regular rate and rhythm. Resp:  Normal effort.  Lungs are clear bilaterally. Abd:  No distention.  Soft, nontender, no CVA tenderness. Other:     ED Results / Procedures / Treatments   Labs (all labs ordered are listed, but only abnormal results are displayed) Labs Reviewed  URINALYSIS, ROUTINE W REFLEX MICROSCOPIC - Abnormal; Notable for the following components:      Result Value   Color, Urine YELLOW (*)    APPearance TURBID (*)    Hgb urine dipstick MODERATE (*)    Protein, ur 100 (*)    Leukocytes,Ua LARGE (*)    RBC / HPF >50 (*)    WBC, UA >50 (*)    Bacteria, UA  RARE (*)    All other components within normal limits  POC URINE PREG, ED      PROCEDURES:  Critical Care performed:   Procedures   MEDICATIONS ORDERED IN ED: Medications - No data to display   IMPRESSION / MDM / ASSESSMENT AND PLAN / ED COURSE  I reviewed the triage vital signs and the nursing notes.   Differential diagnosis includes, but is not limited to, urinary tract infection, kidney stone, hemorrhagic cystitis.  36 year old female presents to the ED with complaint of urinary frequency and hesitancy for approximately 1 week.  She has been taking over-the-counter cranberry tablets with relief until this morning when she saw a tinge of blood in her urine.  She denies any fever, chills, nausea or vomiting.  Patient denies previous stones.  Urinalysis consistent with a urinary tract infection with greater than 50 RBCs and WBCs and rare bacteria.  Patient's pregnancy test was negative.  With no CVA tenderness, fever, chills, nausea or vomiting is less  likely that she has a frank pyelonephritis.  Patient was started on Pyridium 200 mg 3 times daily for frequency and hesitancy and is aware that this will discolor her urine.  A prescription for cefdinir 300 mg twice daily for 10 days was sent to the pharmacy and patient is encouraged to drink fluids to stay hydrated.  Tylenol or ibuprofen as needed for discomfort.  She is to return to the emergency department over the Christmas holiday if any worsening of her symptoms.      Patient's presentation is most consistent with acute complicated illness / injury requiring diagnostic workup.  FINAL CLINICAL IMPRESSION(S) / ED DIAGNOSES   Final diagnoses:  Acute urinary tract infection     Rx / DC Orders   ED Discharge Orders          Ordered    cefdinir (OMNICEF) 300 MG capsule  2 times daily        11/19/22 0811    phenazopyridine (PYRIDIUM) 200 MG tablet  3 times daily PRN        11/19/22 [id number]             Note:  This  document was prepared using Dragon voice recognition software and may include unintentional dictation errors.   Johnn Hai, PA-C 11/19/22 1021    Lucillie Garfinkel, MD 11/20/22 0800

## 2022-11-19 NOTE — ED Triage Notes (Signed)
Patient arrives ambulatory c/o UTI symptoms x 1 week. Started taking OTC meds about 3 days ago. Urine had been cloudy and brown but now has a pink tinge to it.

## 2023-05-22 IMAGING — CR DG RIBS W/ CHEST 3+V*L*
3 series · 3 of 3 positions shown · non-contrast
Comparison: None.

CLINICAL DATA: Fall 6 days ago with continued pain in the left
anterior ribs

EXAM:
LEFT RIBS AND CHEST - 3+ VIEW

[chest pa]
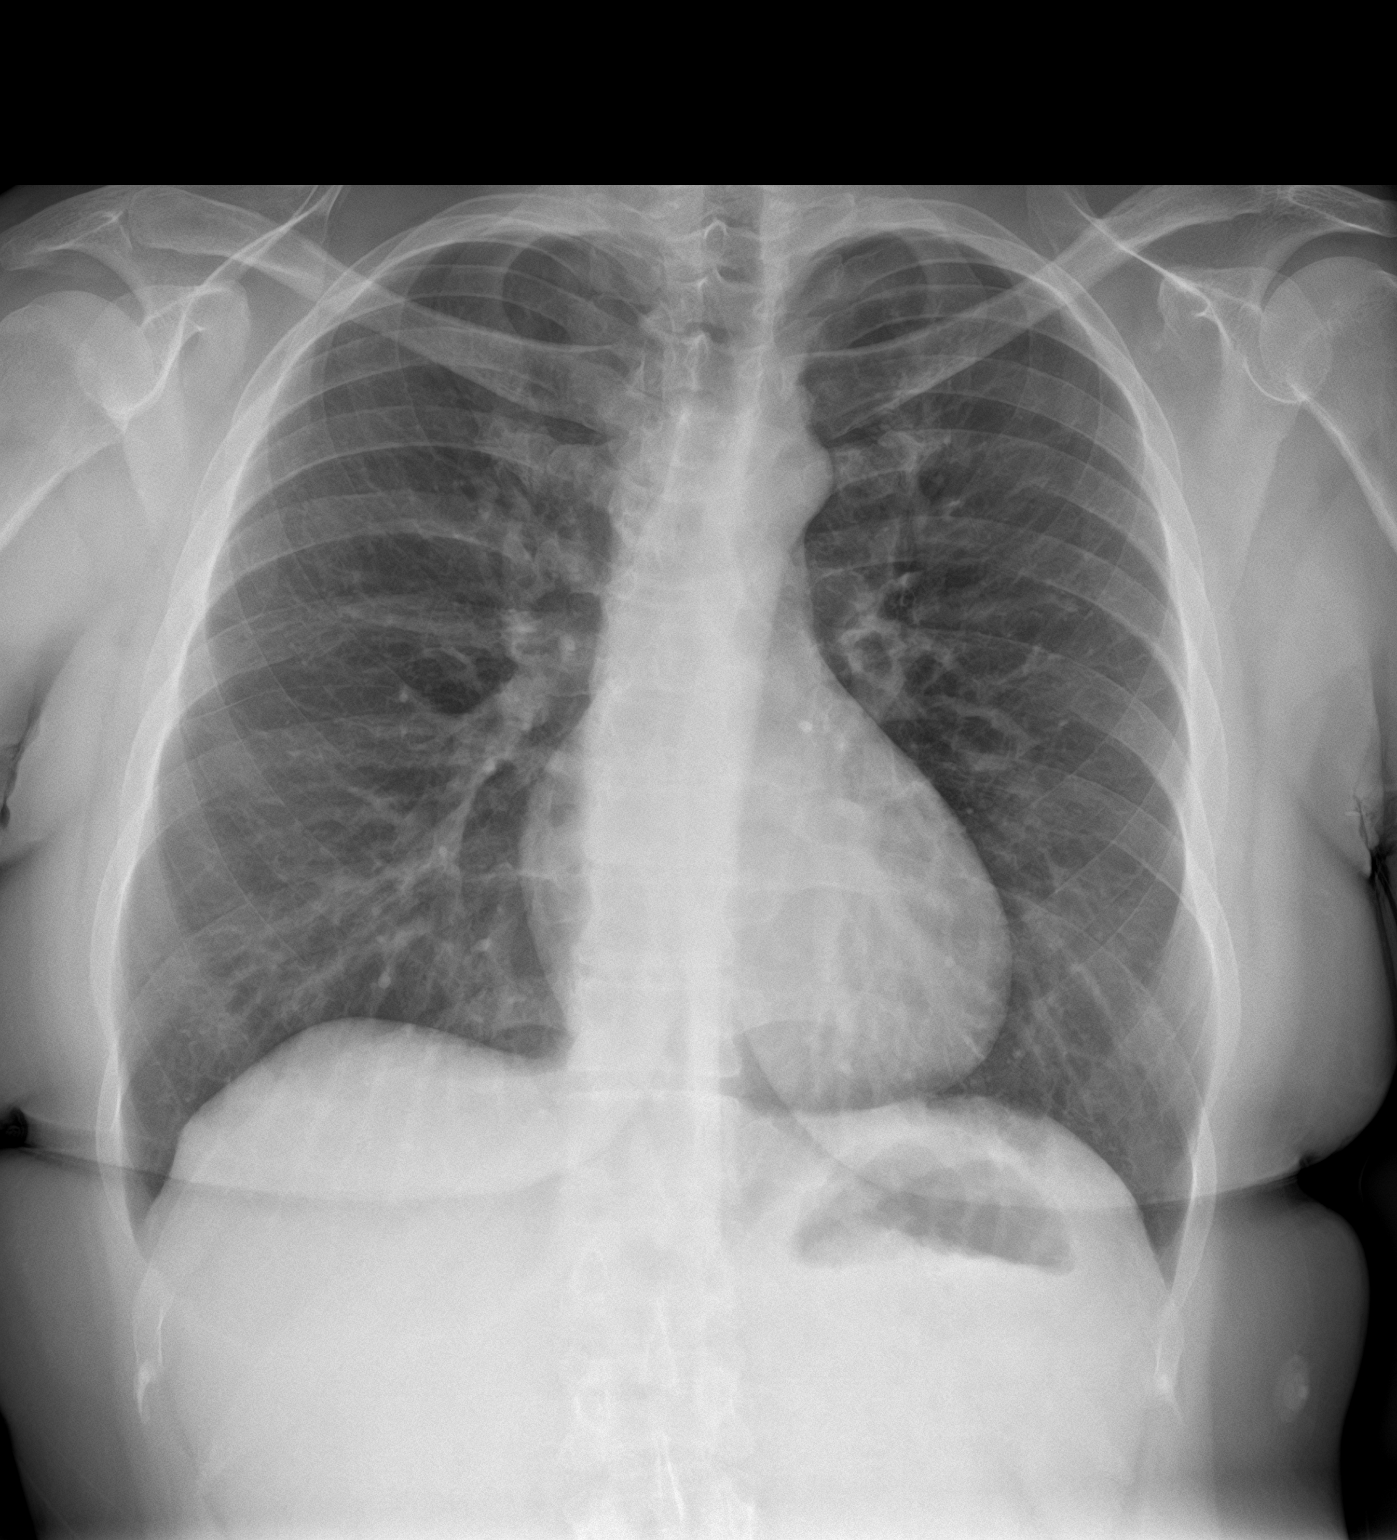

[rib pa]
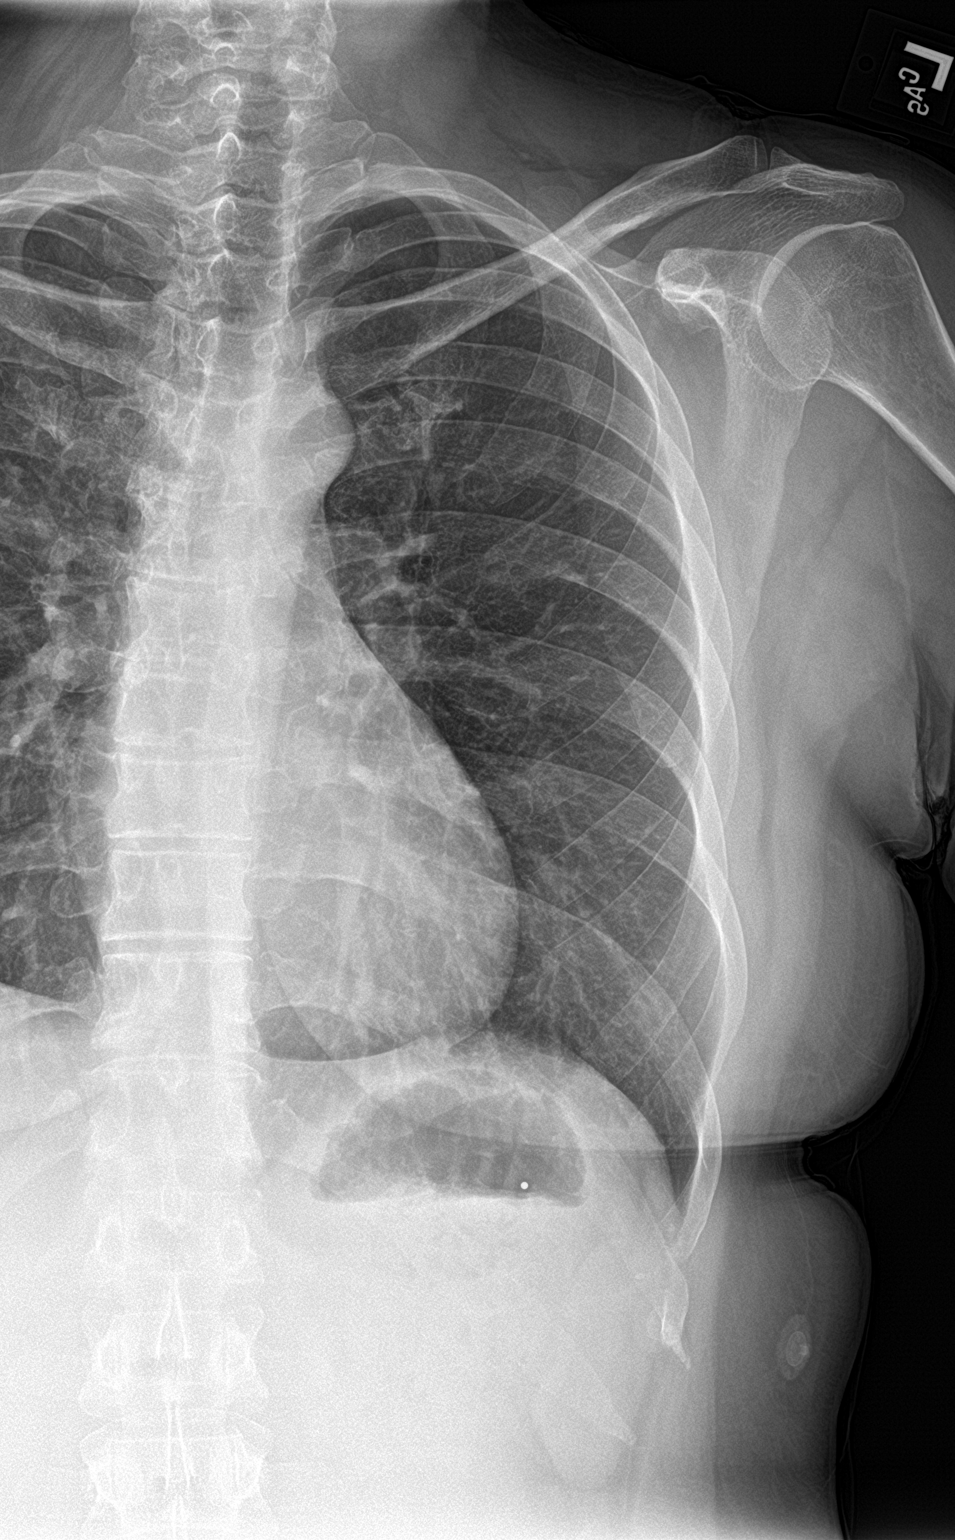

[rib pa obl]
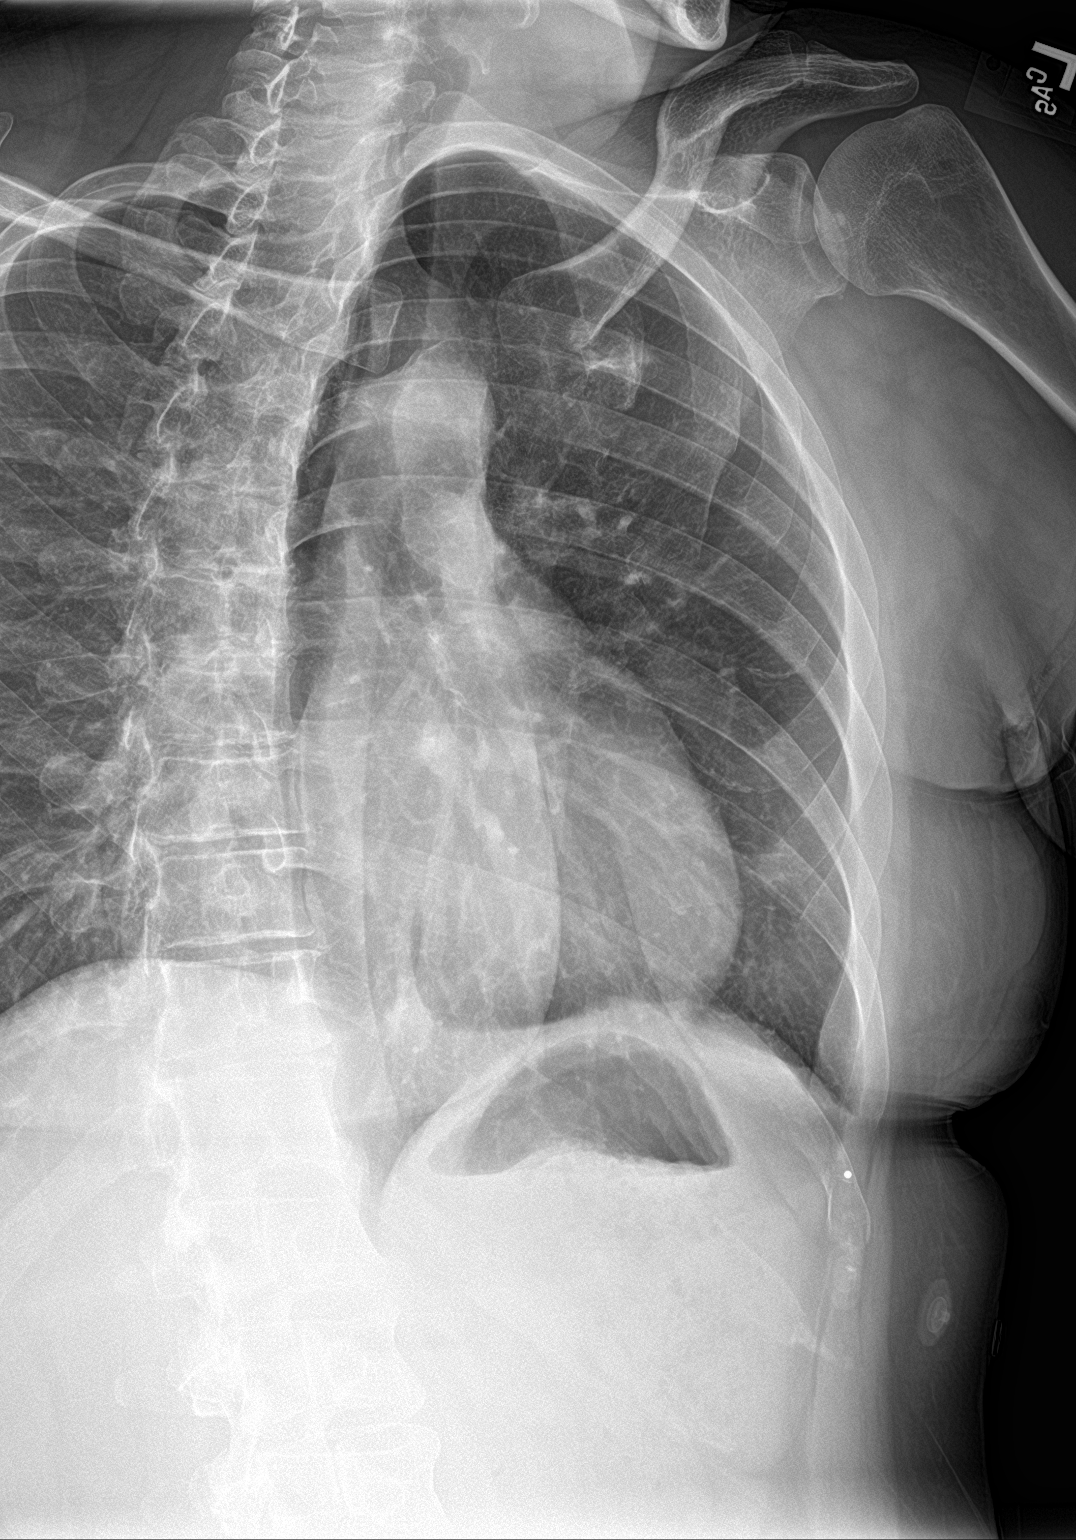

[3 of 3 positions shown; findings below may reference images not displayed]

FINDINGS: No fracture or other bone lesions are seen involving the ribs. There
is no evidence of pneumothorax or pleural effusion. Both lungs are
clear. Heart size and mediastinal contours are within normal limits.
IMPRESSION: Negative.

## 2024-04-09 ENCOUNTER — Other Ambulatory Visit: Payer: Self-pay

## 2024-04-09 ENCOUNTER — Encounter: Payer: Self-pay | Admitting: Emergency Medicine

## 2024-04-09 ENCOUNTER — Emergency Department
Admission: EM | Admit: 2024-04-09 | Discharge: 2024-04-09 | Disposition: A | Discharge status: Home / Self Care | Attending: Emergency Medicine | Admitting: Emergency Medicine

## 2024-04-09 ENCOUNTER — Emergency Department

## 2024-04-09 DIAGNOSIS — N12 Tubulo-interstitial nephritis, not specified as acute or chronic: Secondary | ICD-10-CM | POA: Diagnosis not present

## 2024-04-09 DIAGNOSIS — R109 Unspecified abdominal pain: Secondary | ICD-10-CM | POA: Diagnosis present

## 2024-04-09 LAB — COMPREHENSIVE METABOLIC PANEL WITH GFR
ALT: 16 U/L (ref 0–44)
AST: 19 U/L (ref 15–41)
Albumin: 4 g/dL (ref 3.5–5.0)
Alkaline Phosphatase: 59 U/L (ref 38–126)
Anion gap: 7 (ref 5–15)
BUN: 15 mg/dL (ref 6–20)
CO2: 27 mmol/L (ref 22–32)
Calcium: 9.5 mg/dL (ref 8.9–10.3)
Chloride: 101 mmol/L (ref 98–111)
Creatinine, Ser: 0.7 mg/dL (ref 0.44–1.00)
GFR, Estimated: >60 mL/min (ref >60)
Glucose, Bld: 121 mg/dL — ABNORMAL HIGH (ref 70–99)
Potassium: 4.4 mmol/L (ref 3.5–5.1)
Sodium: 135 mmol/L (ref 135–145)
Total Bilirubin: 0.9 mg/dL (ref 0.0–1.2)
Total Protein: 7.8 g/dL (ref 6.5–8.1)

## 2024-04-09 LAB — URINALYSIS, W/ REFLEX TO CULTURE (INFECTION SUSPECTED)
Bilirubin Urine: NEGATIVE
Glucose, UA: NEGATIVE mg/dL
Ketones, ur: NEGATIVE mg/dL
Nitrite: NEGATIVE
Protein, ur: 100 mg/dL — AB
RBC / HPF: >50 RBC/hpf (ref 0–5)
Specific Gravity, Urine: 1.015 (ref 1.005–1.030)
WBC, UA: >50 WBC/hpf (ref 0–5)
pH: 5 (ref 5.0–8.0)

## 2024-04-09 LAB — CBC WITH DIFFERENTIAL/PLATELET
Abs Immature Granulocytes: 0.09 10*3/uL — ABNORMAL HIGH (ref 0.00–0.07)
Basophils Absolute: 0 10*3/uL (ref 0.0–0.1)
Basophils Relative: 0 %
Eosinophils Absolute: 0 10*3/uL (ref 0.0–0.5)
Eosinophils Relative: 0 %
HCT: 38.6 % (ref 36.0–46.0)
Hemoglobin: 13.1 g/dL (ref 12.0–15.0)
Immature Granulocytes: 1 %
Lymphocytes Relative: 8 %
Lymphs Abs: 1.1 10*3/uL (ref 0.7–4.0)
MCH: 31.7 pg (ref 26.0–34.0)
MCHC: 33.9 g/dL (ref 30.0–36.0)
MCV: 93.5 fL (ref 80.0–100.0)
Monocytes Absolute: 1 10*3/uL (ref 0.1–1.0)
Monocytes Relative: 8 %
Neutro Abs: 11.3 10*3/uL — ABNORMAL HIGH (ref 1.7–7.7)
Neutrophils Relative %: 83 %
Platelets: 208 10*3/uL (ref 150–400)
RBC: 4.13 MIL/uL (ref 3.87–5.11)
RDW: 13.2 % (ref 11.5–15.5)
WBC: 13.6 10*3/uL — ABNORMAL HIGH (ref 4.0–10.5)
nRBC: 0 % (ref 0.0–0.2)

## 2024-04-09 LAB — POC URINE PREG, ED: Preg Test, Ur: NEGATIVE

## 2024-04-09 LAB — LACTIC ACID, PLASMA: Lactic Acid, Venous: 1 mmol/L (ref 0.5–1.9)

## 2024-04-09 MED ORDER — MORPHINE SULFATE (PF) 4 MG/ML IV SOLN
4.0000 mg | Freq: Once | INTRAVENOUS | Status: AC
  Administered 2024-04-09: 4 mg via INTRAVENOUS
  Filled 2024-04-09: qty 1

## 2024-04-09 MED ORDER — SODIUM CHLORIDE 0.9 % IV BOLUS
500.0000 mL | Freq: Once | INTRAVENOUS | Status: AC
  Administered 2024-04-09: 500 mL via INTRAVENOUS

## 2024-04-09 MED ORDER — ONDANSETRON HCL 4 MG/2ML IJ SOLN
4.0000 mg | INTRAMUSCULAR | Status: AC
  Administered 2024-04-09: 4 mg via INTRAVENOUS
  Filled 2024-04-09: qty 2

## 2024-04-09 MED ORDER — IBUPROFEN 800 MG PO TABS
800.0000 mg | ORAL_TABLET | Freq: Once | ORAL | Status: AC
  Administered 2024-04-09: 800 mg via ORAL
  Filled 2024-04-09: qty 1

## 2024-04-09 MED ORDER — SODIUM CHLORIDE 0.9 % IV SOLN
2.0000 g | Freq: Once | INTRAVENOUS | Status: AC
  Administered 2024-04-09: 2 g via INTRAVENOUS
  Filled 2024-04-09: qty 20

## 2024-04-09 MED ORDER — ONDANSETRON 4 MG PO TBDP: ORAL_TABLET | ORAL | 0 refills | Status: AC

## 2024-04-09 MED ORDER — SULFAMETHOXAZOLE-TRIMETHOPRIM 800-160 MG PO TABS: 1.0000 | ORAL_TABLET | Freq: Two times a day (BID) | ORAL | 0 refills | Status: AC

## 2024-04-09 MED ORDER — IOHEXOL 300 MG/ML  SOLN
100.0000 mL | Freq: Once | INTRAMUSCULAR | Status: AC | PRN
  Administered 2024-04-09: 100 mL via INTRAVENOUS

## 2024-04-09 NOTE — Discharge Instructions (Signed)
Your workup today suggests that you have a urinary tract infection (UTI) which has spread to your kidneys.  Please take your antibiotic as prescribed and over-the-counter pain medication (Tylenol or Motrin) as needed, but no more than recommended on the label instructions.  Drink PLENTY of fluids.  Call your regular doctor to schedule the next available appointment to follow up on today's ED visit, or return immediately to the ED if your pain worsens, you have decreased urine production, develop fever, persistent vomiting, or other symptoms that concern you.  

## 2024-04-09 NOTE — ED Triage Notes (Addendum)
 Pt in with LLQ abdominal pain worsening x 3 days. Pt also reports chills and cloudy urine and emesis x 2 days. Temp 100.6, last tylenol  at midnight

## 2024-04-09 NOTE — ED Provider Notes (Signed)
 [facility] Provider Note    Event Date/Time   First MD Initiated Contact with Patient 04/09/24 0250     (approximate)   History   Abdominal Pain and Fever   HPI Tricia Gregory is a 38 y.o. female who presents for evaluation of about 4 days of gradually worsening general malaise, dysuria, and now with the development of left flank pain.  She said it is aching and throbbing.  She has also had nausea and at least 1 episode of vomiting.  She discovered she was febrile tonight.  She said that she has only had 2 urinary tract infections in the past but that is what it feels like.  She denies any respiratory symptoms including chest pain and shortness of breath and cough.     Physical Exam   Triage Vital Signs: ED Triage Vitals [04/09/24 0142]  Encounter Vitals Group     BP 137/71     Systolic BP Percentile      Diastolic BP Percentile      Pulse Rate 91     Resp 18     Temp (!) 100.6 F (38.1 C)     Temp Source Oral     SpO2 100 %     Weight 77.1 kg (170 lb)     Height      Head Circumference      Peak Flow      Pain Score 8     Pain Loc      Pain Education      Exclude from Growth Chart     Most recent vital signs: Vitals:   04/09/24 0142  BP: 137/71  Pulse: 91  Resp: 18  Temp: (!) 100.6 F (38.1 C)  SpO2: 100%    General: Awake, appears uncomfortable but nontoxic. CV:  Good peripheral perfusion.  Borderline tachycardia, regular rhythm. Resp:  Normal effort. Speaking easily and comfortably, no accessory muscle usage nor intercostal retractions.   Abd:  No distention.  No tenderness to palpation of the abdomen and no guarding.  Tenderness to percussion of the left flank.   ED Results / Procedures / Treatments   Labs (all labs ordered are listed, but only abnormal results are displayed) Labs Reviewed  COMPREHENSIVE METABOLIC PANEL WITH GFR - Abnormal; Notable for the following components:      Result Value   Glucose, Bld 121  (*)    All other components within normal limits  CBC WITH DIFFERENTIAL/PLATELET - Abnormal; Notable for the following components:   WBC 13.6 (*)    Neutro Abs 11.3 (*)    Abs Immature Granulocytes 0.09 (*)    All other components within normal limits  URINALYSIS, W/ REFLEX TO CULTURE (INFECTION SUSPECTED) - Abnormal; Notable for the following components:   Color, Urine YELLOW (*)    APPearance CLOUDY (*)    Hgb urine dipstick LARGE (*)    Protein, ur 100 (*)    Leukocytes,Ua LARGE (*)    Bacteria, UA RARE (*)    Non Squamous Epithelial PRESENT (*)    All other components within normal limits  URINE CULTURE  LACTIC ACID, PLASMA  POC URINE PREG, ED     RADIOLOGY See ED course for details regarding imaging   PROCEDURES:  Critical Care performed: No  Procedures    IMPRESSION / MDM / ASSESSMENT AND PLAN / ED COURSE  I reviewed the triage vital signs and the nursing notes.  Differential diagnosis includes, but is not limited to, UTI, pyelonephritis, obstructive ureteral stone, less likely STD/TOA/PID.  Patient's presentation is most consistent with acute presentation with potential threat to life or bodily function.  Labs/studies ordered: Urinalysis, urine culture, lactic acid, urine pregnancy test, CMP, CBC with differential, CT abdomen/pelvis  Interventions/Medications given:  Medications  ibuprofen  (ADVIL ) tablet 800 mg (800 mg Oral Given 04/09/24 0347)  cefTRIAXone (ROCEPHIN) 2 g in sodium chloride 0.9 % 100 mL IVPB (2 g Intravenous New Bag/Given 04/09/24 0342)  sodium chloride 0.9 % bolus 500 mL (500 mLs Intravenous New Bag/Given 04/09/24 0342)  morphine (PF) 4 MG/ML injection 4 mg (4 mg Intravenous Given 04/09/24 0344)  ondansetron  (ZOFRAN ) injection 4 mg (4 mg Intravenous Given 04/09/24 0342)  iohexol (OMNIPAQUE) 300 MG/ML solution 100 mL (100 mLs Intravenous Contrast Given 04/09/24 0417)    (Note:  hospital course my include additional  interventions and/or labs/studies not listed above.)   Patient is febrile, borderline tachycardia, technically meets SIRS criteria.  Strongly positive urine suggestive of UTI and her left leg pain also is suggestive of pyelonephritis.  However I cannot rule out the possibility of an infected, impacted stone.  I talked with the patient about the pros and cons of obtaining a CT scan to evaluate for pyelonephritis and/or urinary obstructive stone and she agrees with the plan.  I will treat empirically with ceftriaxone 2 g IV and a 500 L normal saline bolus.  I am also ordering morphine 4 mg IV and Zofran  4 mg IV for pain and nausea.  She has an order for 600 mg of ibuprofen  as well.  I will reassess to determine if she is appropriate for outpatient management after the CT is back.     Clinical Course as of 04/09/24 0531  Sun Apr 09, 2024  0526 CT ABDOMEN PELVIS W CONTRAST I independently viewed and interpreted the patient's CT of the abdomen and pelvis.  There seems to be some inflammation at least around the left kidney.  Radiology confirmed probable pyelonephritis but there is no evidence of an obstructive ureteral stone. [CF]  [id number] I reassessed the patient and she is feeling better after the fluids and medication.  She is finished her ceftriaxone 2 g IV.  I discussed possible hospitalization with her, but given that she is feeling better and should be able to improve with oral antibiotics, I think is very reasonable to discharge and she agrees.  I gave her strict return precautions and she understands and agrees with the plan. [CF]    Clinical Course User Index [CF] Lynnda Sas, MD     FINAL CLINICAL IMPRESSION(S) / ED DIAGNOSES   Final diagnoses:  Pyelonephritis     Rx / DC Orders   ED Discharge Orders          Ordered    sulfamethoxazole-trimethoprim (BACTRIM DS) 800-160 MG tablet  2 times daily        04/09/24 0531    ondansetron  (ZOFRAN -ODT) 4 MG disintegrating tablet         04/09/24 0531             Note:  This document was prepared using Dragon voice recognition software and may include unintentional dictation errors.   Lynnda Sas, MD 04/09/24 [phone]

## 2024-04-11 LAB — URINE CULTURE: Culture: >=100000 — AB

## 2024-06-25 ENCOUNTER — Emergency Department
Admission: EM | Admit: 2024-06-25 | Discharge: 2024-06-25 | Disposition: A | Discharge status: Home / Self Care | Attending: Emergency Medicine | Admitting: Emergency Medicine

## 2024-06-25 ENCOUNTER — Other Ambulatory Visit: Payer: Self-pay

## 2024-06-25 ENCOUNTER — Emergency Department

## 2024-06-25 DIAGNOSIS — Y9389 Activity, other specified: Secondary | ICD-10-CM | POA: Diagnosis not present

## 2024-06-25 DIAGNOSIS — M7989 Other specified soft tissue disorders: Secondary | ICD-10-CM | POA: Insufficient documentation

## 2024-06-25 DIAGNOSIS — W010XXA Fall on same level from slipping, tripping and stumbling without subsequent striking against object, initial encounter: Secondary | ICD-10-CM | POA: Diagnosis not present

## 2024-06-25 DIAGNOSIS — S6991XA Unspecified injury of right wrist, hand and finger(s), initial encounter: Secondary | ICD-10-CM | POA: Diagnosis present

## 2024-06-25 DIAGNOSIS — S52501A Unspecified fracture of the lower end of right radius, initial encounter for closed fracture: Secondary | ICD-10-CM | POA: Diagnosis not present

## 2024-06-25 DIAGNOSIS — S52611A Displaced fracture of right ulna styloid process, initial encounter for closed fracture: Secondary | ICD-10-CM | POA: Insufficient documentation

## 2024-06-25 MED ORDER — OXYCODONE-ACETAMINOPHEN 5-325 MG PO TABS: 1.0000 | ORAL_TABLET | Freq: Three times a day (TID) | ORAL | 0 refills | Status: AC | PRN

## 2024-06-25 MED ORDER — OXYCODONE-ACETAMINOPHEN 5-325 MG PO TABS
1.0000 | ORAL_TABLET | Freq: Once | ORAL | Status: AC
  Administered 2024-06-25: 1 via ORAL
  Filled 2024-06-25: qty 1

## 2024-06-25 MED ORDER — KETOROLAC TROMETHAMINE 30 MG/ML IJ SOLN
30.0000 mg | Freq: Once | INTRAMUSCULAR | Status: AC
  Administered 2024-06-25: 30 mg via INTRAMUSCULAR
  Filled 2024-06-25: qty 1

## 2024-06-25 NOTE — ED Provider Notes (Signed)
 [facility] Provider Note    Event Date/Time   First MD Initiated Contact with Patient 06/25/24 1354     (approximate)   History   Wrist Injury   HPI  Tricia Gregory is a 38 y.o. female  with a past medical history of methadone  dependence presents to the emergency department with right finger and wrist pain after a fall yesterday around 4:30 PM. Patient states she was outside washing cars when she tripped over the hose and did a FOOSH mechanism of her hand.  Endorses some swelling and numbness of the right hand into the fingers.  She has taken 3 doses of Aleve  at home since the incident.  Denies hitting her head or any other concerns or injuries at this time. Does not use any blood thinners.  Patient reports her methadone  dependence history was related to the birth of her son in 07/2017.  She had a history of methadone  use while pregnant and completed treatment at outpatient facility from 2018 to early 2019.  No opioid use since then.   Physical Exam   Triage Vital Signs: ED Triage Vitals  Encounter Vitals Group     BP 06/25/24 1352 124/86     Girls Systolic BP Percentile --      Girls Diastolic BP Percentile --      Boys Systolic BP Percentile --      Boys Diastolic BP Percentile --      Pulse Rate 06/25/24 1352 88     Resp 06/25/24 1352 20     Temp 06/25/24 1352 98.4 F (36.9 C)     Temp Source 06/25/24 1352 Oral     SpO2 06/25/24 1352 93 %     Weight 06/25/24 1351 165 lb (74.8 kg)     Height 06/25/24 1351 5' 2 (1.575 m)     Head Circumference --      Peak Flow --      Pain Score 06/25/24 1351 10     Pain Loc --      Pain Education --      Exclude from Growth Chart --     Most recent vital signs: Vitals:   06/25/24 1352  BP: 124/86  Pulse: 88  Resp: 20  Temp: 98.4 F (36.9 C)  SpO2: 93%    General: Awake, in no acute distress. Appears stated age. Head: Normocephalic, atraumatic. CV: Regular rate, 88 bpm. Radial pulses 2+ and  symmetric.  Respiratory: No respiratory distress. Normal respiratory effort. MSK: Reduced ROM and strength in right wrist with flexion compared to left. Patient can oppose right thumb to all fingers except the pinky. Skin: Warm, dry, intact. Edema noted to wrist on radial and ulnar sides, anterior and posterior sides, with some ecchymoses.  Neurological: A&Ox4 to person, place, time, and situation. Sensation intact to b/l wrists and fingers. ED Results / Procedures / Treatments   Labs (all labs ordered are listed, but only abnormal results are displayed) Labs Reviewed - No data to display   EKG     RADIOLOGY X rays of right forearm and wrist ordered.  FINDINGS: transverse fracture of the distal radial metaphysis, minimally comminuted and impacted, with mild dorsal angulation of the distal radial articular surface. 3 mm posterior displacement of distal fracture fragment. No definite extension to the radiocarpal joint. There is a minimally displaced fracture of the ulnar styloid. Regional soft tissue swelling.   PROCEDURES:  Critical Care performed: No   Procedures   MEDICATIONS  ORDERED IN ED: Medications  ketorolac  (TORADOL ) 30 MG/ML injection 30 mg (30 mg Intramuscular Given 06/25/24 1426)  oxyCODONE -acetaminophen  (PERCOCET/ROXICET) 5-325 MG per tablet 1 tablet (1 tablet Oral Given 06/25/24 1457)     IMPRESSION / MDM / ASSESSMENT AND PLAN / ED COURSE  I reviewed the triage vital signs and the nursing notes.                              Differential diagnosis includes, but is not limited to, distal radial fracture, distal ulnar fracture, radial or ulnar dislocation, wrist sprain  Patient's presentation is most consistent with acute complicated illness / injury requiring diagnostic workup.  Patient is a 38 year old female who presented today with right wrist and hand pain following a FOOSH injury sustained at home yesterday.  Patient provided with 1 dose of Toradol  IM  here for pain.  Patient is able to move her wrist and fingers of this right upper extremity with encouragement, but it is minimal due to her pain at this time.  X-ray of the right forearm and wrist ordered. I independently viewed the x-rays and radiologist's report.  I agree with the radiologist's report that there is a transverse fracture of the distal radial metaphysis and minimally displaced ulnar styloid fracture; full report is listed above in radiology section.  Patient was placed in a sugar-tong splint and given follow-up information with orthopedics.  I evaluated the splint prior to her being discharged and discussed splint care.  Also provided shoulder sling she can wear as needed for comfort.  I discussed pain control with her and her husband regarding her previous methadone  dependence from 2018-2019.  We discussed using ibuprofen  at home for her pain and she can use the Percocets as needed every 8 hours for severe pain if the ibuprofen  is inefficient.  PDMP was reviewed prior to prescribing this medication.  If there are any concerns for opioid misuse or any other new or worsening symptoms, she can return to the emergency department or follow-up with her primary care provider.  Patient was given the opportunity to ask questions; all questions were answered. Emergency department return precautions were discussed with the patient.  Patient is in agreement to the treatment plan.  Patient is stable for discharge.   Clinical Course as of 06/25/24 1538  Sun Jun 25, 2024  1445 DG Forearm Right [RK]    Clinical Course User Index [RK] Arlander Charleston, MD     FINAL CLINICAL IMPRESSION(S) / ED DIAGNOSES   Final diagnoses:  Closed fracture of distal end of right radius, unspecified fracture morphology, initial encounter  Closed displaced fracture of styloid process of right ulna, initial encounter     Rx / DC Orders   ED Discharge Orders          Ordered    oxyCODONE -acetaminophen  (PERCOCET)  5-325 MG tablet  Every 8 hours PRN        06/25/24 1521             Note:  This document was prepared using Dragon voice recognition software and may include unintentional dictation errors.     Sheron Salm, PA-C 06/25/24 1544    Arlander Charleston, MD 06/25/24 [phone]

## 2024-06-25 NOTE — ED Triage Notes (Signed)
 Pt to ED for R wrist and forearm injury  yesaterday from tripping on hose while washing the cars yesterday. Some swelling noted to forearm and visible deformity to L wrist.

## 2024-06-25 NOTE — Discharge Instructions (Addendum)
 Please read through the included information about splint care (keep it clean and dry).  If your pain becomes much more severe, the splint becomes too tight, or you feel as if your injured limb is becoming numb or cold, please return immediately to the Emergency Department.  Follow up with the orthopedics specialist listed in this paperwork.  When possible, keep your splint elevated to help with the swelling.  You may also use ice packs over the splint.   Please use ibuprofen  first based on the instructions on the bottle, then use the oxycodone -acetaminophen  as needed for severe pain in addition to the ibuprofen .  Please do not take Tylenol  while taking this medication as the oxycodone  has a component of Tylenol  in it.

## 2024-06-25 NOTE — ED Notes (Signed)
 Patient declined discharge vital signs.
